# Patient Record
Sex: Male | Born: 1950 | Race: Black or African American | Hispanic: No | Marital: Married | Smoking: Never smoker
Health system: Southern US, Community
[De-identification: ages and names within clinical notes are randomized; demographics above are authoritative.]

## PROBLEM LIST (undated history)

## (undated) DIAGNOSIS — N183 Chronic kidney disease, stage 3 unspecified: Secondary | ICD-10-CM

## (undated) DIAGNOSIS — I10 Essential (primary) hypertension: Secondary | ICD-10-CM

## (undated) DIAGNOSIS — L089 Local infection of the skin and subcutaneous tissue, unspecified: Secondary | ICD-10-CM

## (undated) DIAGNOSIS — E785 Hyperlipidemia, unspecified: Secondary | ICD-10-CM

## (undated) DIAGNOSIS — E1122 Type 2 diabetes mellitus with diabetic chronic kidney disease: Secondary | ICD-10-CM

## (undated) DIAGNOSIS — E119 Type 2 diabetes mellitus without complications: Secondary | ICD-10-CM

## (undated) DIAGNOSIS — E11628 Type 2 diabetes mellitus with other skin complications: Secondary | ICD-10-CM

## (undated) HISTORY — PX: COLONOSCOPY: SHX174

## (undated) HISTORY — PX: TONSILECTOMY/ADENOIDECTOMY WITH MYRINGOTOMY: SHX6125

## (undated) HISTORY — PX: FOOT SURGERY: SHX648

---

## 2017-10-28 ENCOUNTER — Inpatient Hospital Stay (HOSPITAL_COMMUNITY)
Admission: EM | Admit: 2017-10-28 | Discharge: 2017-10-30 | DRG: 872 | Disposition: A | Discharge status: Home Health | Payer: Medicare Other | Attending: Internal Medicine | Admitting: Internal Medicine

## 2017-10-28 ENCOUNTER — Emergency Department (HOSPITAL_COMMUNITY): Payer: Medicare Other

## 2017-10-28 ENCOUNTER — Encounter (HOSPITAL_COMMUNITY): Payer: Self-pay | Admitting: Emergency Medicine

## 2017-10-28 DIAGNOSIS — A419 Sepsis, unspecified organism: Principal | ICD-10-CM | POA: Diagnosis present

## 2017-10-28 DIAGNOSIS — L97519 Non-pressure chronic ulcer of other part of right foot with unspecified severity: Secondary | ICD-10-CM | POA: Diagnosis present

## 2017-10-28 DIAGNOSIS — F329 Major depressive disorder, single episode, unspecified: Secondary | ICD-10-CM | POA: Diagnosis present

## 2017-10-28 DIAGNOSIS — E785 Hyperlipidemia, unspecified: Secondary | ICD-10-CM | POA: Diagnosis present

## 2017-10-28 DIAGNOSIS — N183 Chronic kidney disease, stage 3 unspecified: Secondary | ICD-10-CM | POA: Diagnosis present

## 2017-10-28 DIAGNOSIS — E1122 Type 2 diabetes mellitus with diabetic chronic kidney disease: Secondary | ICD-10-CM | POA: Diagnosis present

## 2017-10-28 DIAGNOSIS — N184 Chronic kidney disease, stage 4 (severe): Secondary | ICD-10-CM | POA: Diagnosis present

## 2017-10-28 DIAGNOSIS — N179 Acute kidney failure, unspecified: Secondary | ICD-10-CM | POA: Diagnosis present

## 2017-10-28 DIAGNOSIS — M79671 Pain in right foot: Secondary | ICD-10-CM | POA: Diagnosis present

## 2017-10-28 DIAGNOSIS — L03115 Cellulitis of right lower limb: Secondary | ICD-10-CM | POA: Diagnosis present

## 2017-10-28 DIAGNOSIS — L97529 Non-pressure chronic ulcer of other part of left foot with unspecified severity: Secondary | ICD-10-CM | POA: Diagnosis present

## 2017-10-28 DIAGNOSIS — L02611 Cutaneous abscess of right foot: Secondary | ICD-10-CM | POA: Diagnosis present

## 2017-10-28 DIAGNOSIS — E11628 Type 2 diabetes mellitus with other skin complications: Secondary | ICD-10-CM | POA: Diagnosis present

## 2017-10-28 DIAGNOSIS — I10 Essential (primary) hypertension: Secondary | ICD-10-CM | POA: Diagnosis present

## 2017-10-28 DIAGNOSIS — E1165 Type 2 diabetes mellitus with hyperglycemia: Secondary | ICD-10-CM | POA: Diagnosis present

## 2017-10-28 DIAGNOSIS — E11610 Type 2 diabetes mellitus with diabetic neuropathic arthropathy: Secondary | ICD-10-CM | POA: Diagnosis present

## 2017-10-28 DIAGNOSIS — E1142 Type 2 diabetes mellitus with diabetic polyneuropathy: Secondary | ICD-10-CM | POA: Diagnosis present

## 2017-10-28 DIAGNOSIS — L97411 Non-pressure chronic ulcer of right heel and midfoot limited to breakdown of skin: Secondary | ICD-10-CM | POA: Diagnosis not present

## 2017-10-28 DIAGNOSIS — Z794 Long term (current) use of insulin: Secondary | ICD-10-CM

## 2017-10-28 DIAGNOSIS — R739 Hyperglycemia, unspecified: Secondary | ICD-10-CM

## 2017-10-28 DIAGNOSIS — F419 Anxiety disorder, unspecified: Secondary | ICD-10-CM | POA: Diagnosis present

## 2017-10-28 DIAGNOSIS — I129 Hypertensive chronic kidney disease with stage 1 through stage 4 chronic kidney disease, or unspecified chronic kidney disease: Secondary | ICD-10-CM | POA: Diagnosis present

## 2017-10-28 DIAGNOSIS — E11621 Type 2 diabetes mellitus with foot ulcer: Secondary | ICD-10-CM | POA: Diagnosis present

## 2017-10-28 DIAGNOSIS — L089 Local infection of the skin and subcutaneous tissue, unspecified: Secondary | ICD-10-CM | POA: Diagnosis not present

## 2017-10-28 HISTORY — DX: Type 2 diabetes mellitus without complications: E11.9

## 2017-10-28 HISTORY — DX: Local infection of the skin and subcutaneous tissue, unspecified: L08.9

## 2017-10-28 HISTORY — DX: Type 2 diabetes mellitus with other skin complications: E11.628

## 2017-10-28 HISTORY — DX: Type 2 diabetes mellitus with diabetic chronic kidney disease: E11.22

## 2017-10-28 HISTORY — DX: Chronic kidney disease, stage 3 unspecified: N18.30

## 2017-10-28 HISTORY — DX: Hyperlipidemia, unspecified: E78.5

## 2017-10-28 HISTORY — DX: Chronic kidney disease, stage 3 (moderate): N18.3

## 2017-10-28 HISTORY — DX: Essential (primary) hypertension: I10

## 2017-10-28 LAB — I-STAT CHEM 8, ED
BUN: 37 mg/dL — ABNORMAL HIGH (ref 8–23)
Calcium, Ion: 1.2 mmol/L (ref 1.15–1.40)
Chloride: 97 mmol/L — ABNORMAL LOW (ref 98–111)
Creatinine, Ser: 2.5 mg/dL — ABNORMAL HIGH (ref 0.61–1.24)
GLUCOSE: 425 mg/dL — AB (ref 70–99)
HCT: 35 % — ABNORMAL LOW (ref 39.0–52.0)
Hemoglobin: 11.9 g/dL — ABNORMAL LOW (ref 13.0–17.0)
Potassium: 4.6 mmol/L (ref 3.5–5.1)
Sodium: 135 mmol/L (ref 135–145)
TCO2: 29 mmol/L (ref 22–32)

## 2017-10-28 LAB — CBC WITH DIFFERENTIAL/PLATELET
Basophils Absolute: 0 10*3/uL (ref 0.0–0.1)
Basophils Relative: 0 %
EOS ABS: 0 10*3/uL (ref 0.0–0.7)
EOS PCT: 0 %
HCT: 34.8 % — ABNORMAL LOW (ref 39.0–52.0)
HEMOGLOBIN: 11.9 g/dL — AB (ref 13.0–17.0)
LYMPHS ABS: 1.3 10*3/uL (ref 0.7–4.0)
LYMPHS PCT: 8 %
MCH: 30.7 pg (ref 26.0–34.0)
MCHC: 34.2 g/dL (ref 30.0–36.0)
MCV: 89.7 fL (ref 78.0–100.0)
MONOS PCT: 11 %
Monocytes Absolute: 1.7 10*3/uL — ABNORMAL HIGH (ref 0.1–1.0)
Neutro Abs: 13.2 10*3/uL — ABNORMAL HIGH (ref 1.7–7.7)
Neutrophils Relative %: 81 %
Platelets: 306 10*3/uL (ref 150–400)
RBC: 3.88 MIL/uL — AB (ref 4.22–5.81)
RDW: 13.8 % (ref 11.5–15.5)
WBC: 16.3 10*3/uL — AB (ref 4.0–10.5)

## 2017-10-28 LAB — I-STAT CG4 LACTIC ACID, ED: Lactic Acid, Venous: 1.8 mmol/L (ref 0.5–1.9)

## 2017-10-28 LAB — GLUCOSE, CAPILLARY: GLUCOSE-CAPILLARY: 318 mg/dL — AB (ref 70–99)

## 2017-10-28 MED ORDER — ONDANSETRON HCL 4 MG PO TABS: 4.0000 mg | ORAL_TABLET | Freq: Four times a day (QID) | ORAL | Status: DC | PRN

## 2017-10-28 MED ORDER — ACETAMINOPHEN 650 MG RE SUPP: 650.0000 mg | Freq: Four times a day (QID) | RECTAL | Status: DC | PRN

## 2017-10-28 MED ORDER — ONDANSETRON HCL 4 MG/2ML IJ SOLN
4.0000 mg | Freq: Four times a day (QID) | INTRAMUSCULAR | Status: DC | PRN
  Administered 2017-10-29: 4 mg via INTRAVENOUS
  Filled 2017-10-28: qty 2

## 2017-10-28 MED ORDER — ACETAMINOPHEN 325 MG PO TABS
650.0000 mg | ORAL_TABLET | Freq: Once | ORAL | Status: AC
  Administered 2017-10-28: 650 mg via ORAL
  Filled 2017-10-28: qty 2

## 2017-10-28 MED ORDER — MORPHINE SULFATE (PF) 4 MG/ML IV SOLN
4.0000 mg | Freq: Once | INTRAVENOUS | Status: AC
  Administered 2017-10-28: 4 mg via INTRAVENOUS
  Filled 2017-10-28: qty 1

## 2017-10-28 MED ORDER — CLINDAMYCIN PHOSPHATE 600 MG/50ML IV SOLN
600.0000 mg | Freq: Once | INTRAVENOUS | Status: AC
  Administered 2017-10-28: 600 mg via INTRAVENOUS
  Filled 2017-10-28: qty 50

## 2017-10-28 MED ORDER — TRANDOLAPRIL 2 MG PO TABS
2.0000 mg | ORAL_TABLET | Freq: Every day | ORAL | Status: DC
  Administered 2017-10-29 – 2017-10-30 (×2): 2 mg via ORAL
  Filled 2017-10-28 (×2): qty 1

## 2017-10-28 MED ORDER — VANCOMYCIN HCL 10 G IV SOLR
2000.0000 mg | Freq: Once | INTRAVENOUS | Status: AC
  Administered 2017-10-29: 2000 mg via INTRAVENOUS
  Filled 2017-10-28: qty 2000

## 2017-10-28 MED ORDER — INSULIN ASPART 100 UNIT/ML ~~LOC~~ SOLN
0.0000 [IU] | Freq: Three times a day (TID) | SUBCUTANEOUS | Status: DC
  Administered 2017-10-29 (×3): 11 [IU] via SUBCUTANEOUS
  Administered 2017-10-30: 3 [IU] via SUBCUTANEOUS
  Administered 2017-10-30: 8 [IU] via SUBCUTANEOUS

## 2017-10-28 MED ORDER — ACETAMINOPHEN 325 MG PO TABS
650.0000 mg | ORAL_TABLET | Freq: Four times a day (QID) | ORAL | Status: DC | PRN
  Administered 2017-10-29 (×2): 650 mg via ORAL
  Filled 2017-10-28 (×2): qty 2

## 2017-10-28 MED ORDER — ENOXAPARIN SODIUM 60 MG/0.6ML ~~LOC~~ SOLN
60.0000 mg | Freq: Every day | SUBCUTANEOUS | Status: DC
  Administered 2017-10-28 – 2017-10-29 (×2): 60 mg via SUBCUTANEOUS
  Filled 2017-10-28 (×2): qty 0.6

## 2017-10-28 MED ORDER — INSULIN ASPART 100 UNIT/ML ~~LOC~~ SOLN
10.0000 [IU] | Freq: Once | SUBCUTANEOUS | Status: AC
  Administered 2017-10-28: 10 [IU] via SUBCUTANEOUS
  Filled 2017-10-28: qty 1

## 2017-10-28 MED ORDER — QUETIAPINE FUMARATE 100 MG PO TABS
200.0000 mg | ORAL_TABLET | Freq: Every day | ORAL | Status: DC
  Administered 2017-10-28 – 2017-10-29 (×2): 200 mg via ORAL
  Filled 2017-10-28 (×2): qty 2

## 2017-10-28 MED ORDER — INSULIN GLARGINE 100 UNIT/ML ~~LOC~~ SOLN
30.0000 [IU] | Freq: Every day | SUBCUTANEOUS | Status: DC
  Administered 2017-10-28 – 2017-10-29 (×2): 30 [IU] via SUBCUTANEOUS
  Filled 2017-10-28 (×3): qty 0.3

## 2017-10-28 MED ORDER — MOEXIPRIL-HYDROCHLOROTHIAZIDE 15-25 MG PO TABS: 1.0000 | ORAL_TABLET | Freq: Every day | ORAL | Status: DC

## 2017-10-28 MED ORDER — INSULIN ASPART 100 UNIT/ML ~~LOC~~ SOLN
4.0000 [IU] | Freq: Three times a day (TID) | SUBCUTANEOUS | Status: DC
  Administered 2017-10-29 – 2017-10-30 (×5): 4 [IU] via SUBCUTANEOUS

## 2017-10-28 MED ORDER — VANCOMYCIN HCL 10 G IV SOLR: 1250.0000 mg | INTRAVENOUS | Status: DC

## 2017-10-28 MED ORDER — HYDROCHLOROTHIAZIDE 25 MG PO TABS
25.0000 mg | ORAL_TABLET | Freq: Every day | ORAL | Status: DC
  Administered 2017-10-29 – 2017-10-30 (×2): 25 mg via ORAL
  Filled 2017-10-28 (×2): qty 1

## 2017-10-28 MED ORDER — L-METHYLFOLATE-B6-B12 3-35-2 MG PO TABS
1.0000 | ORAL_TABLET | Freq: Every day | ORAL | Status: DC
  Administered 2017-10-29 – 2017-10-30 (×2): 1 via ORAL
  Filled 2017-10-28 (×2): qty 1

## 2017-10-28 MED ORDER — LIDOCAINE-EPINEPHRINE (PF) 2 %-1:200000 IJ SOLN
10.0000 mL | Freq: Once | INTRAMUSCULAR | Status: AC
  Administered 2017-10-28: 10 mL via INTRADERMAL
  Filled 2017-10-28: qty 20

## 2017-10-28 MED ORDER — SODIUM CHLORIDE 0.9 % IV BOLUS
1000.0000 mL | Freq: Once | INTRAVENOUS | Status: AC
  Administered 2017-10-28: 1000 mL via INTRAVENOUS

## 2017-10-28 MED ORDER — GABAPENTIN 300 MG PO CAPS
300.0000 mg | ORAL_CAPSULE | Freq: Three times a day (TID) | ORAL | Status: DC
  Administered 2017-10-28 – 2017-10-30 (×5): 300 mg via ORAL
  Filled 2017-10-28 (×5): qty 1

## 2017-10-28 MED ORDER — DIVALPROEX SODIUM ER 500 MG PO TB24
1500.0000 mg | ORAL_TABLET | Freq: Every day | ORAL | Status: DC
  Administered 2017-10-28 – 2017-10-29 (×2): 1500 mg via ORAL
  Filled 2017-10-28 (×2): qty 3

## 2017-10-28 MED ORDER — VANCOMYCIN HCL IN DEXTROSE 1-5 GM/200ML-% IV SOLN
1000.0000 mg | INTRAVENOUS | Status: DC
  Filled 2017-10-28: qty 200

## 2017-10-28 MED ORDER — BUMETANIDE 1 MG PO TABS: 2.0000 mg | ORAL_TABLET | Freq: Every day | ORAL | Status: DC

## 2017-10-28 MED ORDER — SODIUM CHLORIDE 0.9 % IV SOLN
2.0000 g | Freq: Three times a day (TID) | INTRAVENOUS | Status: DC
  Administered 2017-10-28 – 2017-10-29 (×2): 2 g via INTRAVENOUS
  Filled 2017-10-28 (×4): qty 2

## 2017-10-28 NOTE — ED Triage Notes (Signed)
Pt c/o right foot swelling and pain for over week with erythema and possibly infected. Pt wears brace on left lower leg and foot and reports having pain in that foot with swelling as well even with wearing compression stocking. Pt reports that his wife had appts and he wasn't able to go to his PCP yesterday.

## 2017-10-28 NOTE — ED Notes (Signed)
Antibiotics administer by previous RN before blood cultures were obtained by this writter. We were only able to obtain one set of blood cultures.

## 2017-10-28 NOTE — Progress Notes (Signed)
ED TO INPATIENT HANDOFF REPORT  Name/Age/Gender Carl Wilson 67 y.o. male  Code Status    Code Status Orders  (From admission, onward)         Start     Ordered   10/28/17 2135  Full code  Continuous     10/28/17 2135        Code Status History    This patient has a current code status but no historical code status.      Home/SNF/Other Home  Chief Complaint bilateral foot pain  Level of Care/Admitting Diagnosis ED Disposition    ED Disposition Condition Comment   Admit  Hospital Area: [facility] [[id number]]  Level of Care: Med-Surg [16]  Diagnosis: Abscess of right foot [644034]  Admitting Physician: Hillary Bow [[id number]]  Attending Physician: Hillary Bow [[id number]]  Estimated length of stay: past midnight tomorrow  Certification:: I certify this patient will need inpatient services for at least 2 midnights  PT Class (Do Not Modify): Inpatient [101]  PT Acc Code (Do Not Modify): Private [1]       Medical History Past Medical History:  Diagnosis Date  . CKD stage 3 due to type 2 diabetes mellitus (HCC)   . Diabetes mellitus without complication (HCC)   . Diabetic foot infection (HCC)   . HLD (hyperlipidemia)   . HTN (hypertension)     Allergies Allergies  Allergen Reactions  . Ambien [Zolpidem] Other (See Comments)    hallucinations  . Penicillins Hives    Has patient had a PCN reaction causing immediate rash, facial/tongue/throat swelling, SOB or lightheadedness with hypotension: Yes Has patient had a PCN reaction causing severe rash involving mucus membranes or skin necrosis: No Has patient had a PCN reaction that required hospitalization: No Has patient had a PCN reaction occurring within the last 10 years: Yes If all of the above answers are "NO", then may proceed with Cephalosporin use.  . Sulfamethoxazole Hives  . Tape Rash    Can only use cloth/paper tape    IV Location/Drains/Wounds Patient Lines/Drains/Airways  Status   Active Line/Drains/Airways    Name:   Placement date:   Placement time:   Site:   Days:   Peripheral IV 10/28/17 Right;Anterior Forearm   10/28/17    1757    Forearm   less than 1          Labs/Imaging Results for orders placed or performed during the hospital encounter of 10/28/17 (from the past 48 hour(s))  CBC with Differential/Platelet     Status: Abnormal   Collection Time: 10/28/17  3:55 PM  Result Value Ref Range   WBC 16.3 (H) 4.0 - 10.5 K/uL   RBC 3.88 (L) 4.22 - 5.81 MIL/uL   Hemoglobin 11.9 (L) 13.0 - 17.0 g/dL   HCT 87.5 (L) 64.3 - 32.9 %   MCV 89.7 78.0 - 100.0 fL   MCH 30.7 26.0 - 34.0 pg   MCHC 34.2 30.0 - 36.0 g/dL   RDW 51.8 84.1 - 66.0 %   Platelets 306 150 - 400 K/uL   Neutrophils Relative % 81 %   Neutro Abs 13.2 (H) 1.7 - 7.7 K/uL   Lymphocytes Relative 8 %   Lymphs Abs 1.3 0.7 - 4.0 K/uL   Monocytes Relative 11 %   Monocytes Absolute 1.7 (H) 0.1 - 1.0 K/uL   Eosinophils Relative 0 %   Eosinophils Absolute 0.0 0.0 - 0.7 K/uL   Basophils Relative 0 %   Basophils  Absolute 0.0 0.0 - 0.1 K/uL    Comment: Performed at [facility], [address], [place], [place] [address]  I-stat chem 8, ed     Status: Abnormal   Collection Time: 10/28/17  6:04 PM  Result Value Ref Range   Sodium 135 135 - 145 mmol/L   Potassium 4.6 3.5 - 5.1 mmol/L   Chloride 97 (L) 98 - 111 mmol/L   BUN 37 (H) 8 - 23 mg/dL   Creatinine, Ser 7.84 (H) 0.61 - 1.24 mg/dL   Glucose, Bld 696 (H) 70 - 99 mg/dL   Calcium, Ion 2.95 1.15 - 1.40 mmol/L   TCO2 29 22 - 32 mmol/L   Hemoglobin 11.9 (L) 13.0 - 17.0 g/dL   HCT 28.4 (L) 13.2 - 44.0 %  I-Stat CG4 Lactic Acid, ED     Status: None   Collection Time: 10/28/17  6:10 PM  Result Value Ref Range   Lactic Acid, Venous 1.80 0.5 - 1.9 mmol/L   Dg Foot Complete Right  Result Date: 10/28/2017 CLINICAL DATA:  Foot infection EXAM: RIGHT FOOT COMPLETE - 3+ VIEW COMPARISON:  None. FINDINGS: Soft tissue swelling of the  included ankle and foot with pes planus deformity. 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 No acute fracture nor joint dislocations. No frank bone destruction. The tufts of the first and second digits are excluded on the oblique view but appear unremarkable on the additional views. There is osteoarthritis of the fifth PIP articulation with joint space narrowing, sclerosis and spurring. No apparent soft tissue ulceration. IMPRESSION: 1. Pes planus with tibiotalar, subtalar and midfoot osteoarthritis. Mild osteoarthritis of the fifth PIP. 2. Soft tissue swelling of the included ankle and foot. 3. No frank bone destruction. Electronically Signed   By: Tollie Eth M.D.   On: 10/28/2017 17:58    Pending Labs Unresulted Labs (From admission, onward)    Start     Ordered   10/29/17 0500  CBC  Tomorrow morning,   R     10/28/17 2135   10/29/17 0500  Basic metabolic panel  Tomorrow morning,   R     10/28/17 2135   10/29/17 0500  Creatinine, serum  Every 48 hours,   R     10/28/17 2210   10/28/17 2136  Hemoglobin A1c  Once,   R     10/28/17 2148   10/28/17 2136  Sedimentation rate  Once,   R     10/28/17 2148   10/28/17 2136  C-reactive protein  Once,   R     10/28/17 2148   10/28/17 2136  Prealbumin  Once,   R     10/28/17 2148   10/28/17 2134  HIV antibody (Routine Testing)  Once,   R     10/28/17 2135   10/28/17 2134  Aerobic Culture (superficial specimen)  STAT,   R     10/28/17 2135   10/28/17 1837  Blood culture (routine x 2)  BLOOD CULTURE X 2,   STAT     10/28/17 1836          Vitals/Pain Today's Vitals   10/28/17 1847 10/28/17 2017 10/28/17 2100 10/28/17 2200  BP:  (!) 161/83 (!) 148/81 138/67  Pulse:  61 96 92  Resp:  12    Temp:      TempSrc:      SpO2:  98% 97% 97%  Weight:      Height:      PainSc: 5        Isolation Precautions No active  isolations  Medications Medications  acetaminophen (TYLENOL) tablet 650 mg (has no administration in time range)    Or  acetaminophen (TYLENOL) suppository 650 mg (has no administration in time range)  ondansetron (ZOFRAN) tablet 4 mg (has no administration in time range)    Or  ondansetron (ZOFRAN) injection 4 mg (has no administration in time range)  enoxaparin (LOVENOX) injection 40 mg (has no administration in time range)  insulin aspart (novoLOG) injection 4 Units (has no administration in time range)  insulin aspart (novoLOG) injection 0-15 Units (has no administration in time range)  insulin glargine (LANTUS) injection 30 Units (has no administration in time range)  divalproex (DEPAKOTE ER) 24 hr tablet 1,500 mg (has no administration in time range)  gabapentin (NEURONTIN) capsule 300 mg (has no administration in time range)  QUEtiapine (SEROQUEL) tablet 200 mg (has no administration in time range)  l-methylfolate-B6-B12 (METANX) 3-35-2 MG per tablet 1 tablet (has no administration in time range)  moexipril-hydrochlorothiazide (UNIRETIC) 15-25 MG per tablet 1 tablet (has no administration in time range)  vancomycin (VANCOCIN) 2,000 mg in sodium chloride 0.9 % 500 mL IVPB (has no administration in time range)  aztreonam (AZACTAM) 2 g in sodium chloride 0.9 % 100 mL IVPB (has no administration in time range)  vancomycin (VANCOCIN) IVPB 1000 mg/200 mL premix (has no administration in time range)  sodium chloride 0.9 % bolus 1,000 mL (0 mLs Intravenous Stopped 10/28/17 1837)  morphine 4 MG/ML injection 4 mg (4 mg Intravenous Given 10/28/17 1800)  lidocaine-EPINEPHrine (XYLOCAINE W/EPI) 2 %-1:200000 (PF) injection 10 mL (10 mLs Intradermal Given by Other 10/28/17 1800)  clindamycin (CLEOCIN) IVPB 600 mg (0 mg Intravenous Stopped 10/28/17 1920)  acetaminophen (TYLENOL) tablet 650 mg (650 mg Oral Given 10/28/17 1848)  insulin aspart (novoLOG) injection 10 Units (10 Units Subcutaneous Given 10/28/17  1847)    Mobility walks

## 2017-10-28 NOTE — ED Notes (Signed)
Antibiotics started before blood cultures were able to be collected.

## 2017-10-28 NOTE — H&P (Addendum)
History and Physical    Carl Wilson:[id number] DOB: 11-Feb-1951 DOA: 10/28/2017  PCP: Patient, No Pcp Per  Patient coming from: Home  I have personally briefly reviewed patient's old medical records in [facility]  Chief Complaint: R foot infection  HPI: Carl Wilson is a 67 y.o. male with medical history significant of DM2, HTN, CKD stage 3, HLD, prior diabetic foot infections.  Patient presents to the ED with c/o R foot pain and swelling.  L 5th metatarsal injury to foot a cuple of months ago.  Pain and swelling to R foot over past week.  Associated erythema, and developed fever/chills today so family encouraged him to come in to ED.   ED Course: Tm 100.5.  WBC 16.3k.  Creat 2.5 with BUN 37.  Not exactly sure of what his creat is at baseline, knows it went up to ~4 they think a month or two ago, got taken off of diuretics and ACEi, then went down to 1.9.  Then put back on some diuretics and ACEi.  EDP preformed bedside I+D of foot with purulent drainage.  BCx obtained, but no wound cultures obtained during I+D procedure.   Review of Systems: As per HPI otherwise 10 point review of systems negative.   Past Medical History:  Diagnosis Date  . CKD stage 3 due to type 2 diabetes mellitus (HCC)   . Diabetes mellitus without complication (HCC)   . Diabetic foot infection (HCC)   . HLD (hyperlipidemia)   . HTN (hypertension)     Past Surgical History:  Procedure Laterality Date  . COLONOSCOPY    . FOOT SURGERY    . TONSILECTOMY/ADENOIDECTOMY WITH MYRINGOTOMY       reports that he has never smoked. He has never used smokeless tobacco. He reports that he drank alcohol. He reports that he has current or past drug history.  Allergies  Allergen Reactions  . Ambien [Zolpidem] Other (See Comments)    hallucinations  . Penicillins Hives    Has patient had a PCN reaction causing immediate rash, facial/tongue/throat swelling, SOB or lightheadedness with hypotension: Yes Has  patient had a PCN reaction causing severe rash involving mucus membranes or skin necrosis: No Has patient had a PCN reaction that required hospitalization: No Has patient had a PCN reaction occurring within the last 10 years: Yes If all of the above answers are "NO", then may proceed with Cephalosporin use.  . Sulfamethoxazole Hives  . Tape Rash    Can only use cloth/paper tape    Family History  Problem Relation Age of Onset  . Breast cancer Mother   . Thyroid cancer Brother   . Hypertension Father   . Alzheimer's disease Father      Prior to Admission medications   Medication Sig Start Date End Date Taking? Authorizing Provider  bumetanide (BUMEX) 2 MG tablet Take 2 mg by mouth daily. 10/21/17  Yes [provider]  divalproex (DEPAKOTE ER) 500 MG 24 hr tablet Take 1,500 mg by mouth at bedtime.   Yes [provider]  gabapentin (NEURONTIN) 300 MG capsule Take 300 mg by mouth 3 (three) times daily.   Yes [provider]  Insulin Lispro Prot & Lispro (HUMALOG 50/50 MIX) (50-50) 100 UNIT/ML Kwikpen Inject 60 Units into the skin 2 (two) times daily.   Yes [provider]  l-methylfolate-B6-B12 (METANX) 3-35-2 MG TABS tablet Take 1 tablet by mouth daily.   Yes [provider]  moexipril-hydrochlorothiazide (UNIRETIC) 15-25 MG tablet Take  1 tablet by mouth daily.   Yes [provider]  potassium chloride SA (K-DUR,KLOR-CON) 20 MEQ tablet Take 20 mEq by mouth daily. 09/26/17  Yes [provider]  QUEtiapine (SEROQUEL) 100 MG tablet Take 200 mg by mouth at bedtime.   Yes [provider]  Semaglutide (OZEMPIC [place]) Inject 1 mg into the skin every 7 (seven) days.   Yes [provider]  sildenafil (VIAGRA) 50 MG tablet Take 50 mg by mouth daily as needed for erectile dysfunction.   Yes [provider]    Physical Exam: Vitals:   10/28/17 1754 10/28/17 1810 10/28/17 1813 10/28/17 2017  BP:  (!) 150/74 (!)  150/74 (!) 161/83  Pulse:  (!) 103 (!) 106 61  Resp:  16 15 12   Temp: (!) 100.5 F (38.1 C)     TempSrc: Rectal     SpO2:  98% 98% 98%  Weight:      Height:        Constitutional: NAD, calm, comfortable Eyes: PERRL, lids and conjunctivae normal ENMT: Mucous membranes are moist. Posterior pharynx clear of any exudate or lesions.Normal dentition.  Neck: normal, supple, no masses, no thyromegaly Respiratory: clear to auscultation bilaterally, no wheezing, no crackles. Normal respiratory effort. No accessory muscle use.  Cardiovascular: Regular rate and rhythm, no murmurs / rubs / gallops. No extremity edema. 2+ pedal pulses. No carotid bruits.  Abdomen: no tenderness, no masses palpated. No hepatosplenomegaly. Bowel sounds positive.  Musculoskeletal: no clubbing / cyanosis. No joint deformity upper and lower extremities. Good ROM, no contractures. Normal muscle tone.  Skin: Edema, erythema, swelling of plantar surface of R foot. Neurologic: CN 2-12 grossly intact. Sensation intact, DTR normal. Strength 5/5 in all 4.  Psychiatric: Normal judgment and insight. Alert and oriented x 3. Normal mood.    Labs on Admission: I have personally reviewed following labs and imaging studies  CBC: Recent Labs  Lab 10/28/17 1555 10/28/17 1804  WBC 16.3*  --   NEUTROABS 13.2*  --   HGB 11.9* 11.9*  HCT 34.8* 35.0*  MCV 89.7  --   PLT 306  --    Basic Metabolic Panel: Recent Labs  Lab 10/28/17 1804  NA 135  K 4.6  CL 97*  GLUCOSE 425*  BUN 37*  CREATININE 2.50*   GFR: Estimated Creatinine Clearance: 42.3 mL/min (A) (by C-G formula based on SCr of 2.5 mg/dL (H)). Liver Function Tests: No results for input(s): AST, ALT, ALKPHOS, BILITOT, PROT, ALBUMIN in the last 168 hours. No results for input(s): LIPASE, AMYLASE in the last 168 hours. No results for input(s): AMMONIA in the last 168 hours. Coagulation Profile: No results for input(s): INR, PROTIME in the last 168 hours. Cardiac  Enzymes: No results for input(s): CKTOTAL, CKMB, CKMBINDEX, TROPONINI in the last 168 hours. BNP (last 3 results) No results for input(s): PROBNP in the last 8760 hours. HbA1C: No results for input(s): HGBA1C in the last 72 hours. CBG: No results for input(s): GLUCAP in the last 168 hours. Lipid Profile: No results for input(s): CHOL, HDL, LDLCALC, TRIG, CHOLHDL, LDLDIRECT in the last 72 hours. Thyroid Function Tests: No results for input(s): TSH, T4TOTAL, FREET4, T3FREE, THYROIDAB in the last 72 hours. Anemia Panel: No results for input(s): VITAMINB12, FOLATE, FERRITIN, TIBC, IRON, RETICCTPCT in the last 72 hours. Urine analysis: No results found for: COLORURINE, APPEARANCEUR, LABSPEC, PHURINE, GLUCOSEU, HGBUR, BILIRUBINUR, KETONESUR, PROTEINUR, UROBILINOGEN, NITRITE, LEUKOCYTESUR  Radiological Exams on Admission: Dg Foot Complete Right  Result Date: 10/28/2017  CLINICAL DATA:  Foot infection EXAM: RIGHT FOOT COMPLETE - 3+ VIEW COMPARISON:  None. FINDINGS: Soft tissue swelling of the included ankle and foot with pes planus deformity. 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 No acute fracture nor joint dislocations. No frank bone destruction. The tufts of the first and second digits are excluded on the oblique view but appear unremarkable on the additional views. There is osteoarthritis of the fifth PIP articulation with joint space narrowing, sclerosis and spurring. No apparent soft tissue ulceration. IMPRESSION: 1. Pes planus with tibiotalar, subtalar and midfoot osteoarthritis. Mild osteoarthritis of the fifth PIP. 2. Soft tissue swelling of the included ankle and foot. 3. No frank bone destruction. Electronically Signed   By: Tollie Eth M.D.   On: 10/28/2017 17:58    EKG: Independently reviewed.  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 R foot abscess / diabetic foot infection - 1. Diabetic foot pathway 2. EDP did bedside I+D 3. See if we can get wound cultures 4. BCx pending 5. Empiric aztreonam and vanc (PCN allergy). 6. Call either Ortho surgery or podiatry in AM 2. DM2 - 1. Lantus 30 QHS 2. Novolog 4u mealtime 3. Mod scale SSI AC 4. Diabetes coordinator consult as part of foot pathway 3. HTN - 1. Will leave him on the Uniretic 2. Holding Bumex for the moment 4. CKD stage 3 - 1. Sending for records 2. Think it sounds like he may be close to baseline but not 100% certain. 3. Holding bumex, got 1L bolus in ED 4. Repeat BMP in AM and daily while on vanc.  DVT prophylaxis: Lovenox Code Status: Full Family Communication: Family at bedside Disposition Plan: Home after admit Consults called: None, Call either podiatry or ortho surgery in AM Admission status: Admit to inpatient   Hillary Bow DO [facility] Pager [phone] Only works nights!  If 7AM-7PM, please contact the primary day team physician taking care of patient  [email or web address] Password [email or web address]  10/28/2017, 10:10 PM

## 2017-10-28 NOTE — ED Notes (Signed)
Unsuccessful attempt to draw second set of blood cultures.

## 2017-10-28 NOTE — ED Provider Notes (Signed)
[facility]-EMERGENCY DEPT Provider Note   CSN: [id number] Arrival date & time: 10/28/17  1527     History   Chief Complaint Chief Complaint  Patient presents with  . Foot Swelling  . Foot Pain    HPI Carl Wilson is a 67 y.o. male.  The history is provided by the patient and the spouse. No language interpreter was used.  Foot Pain      67 year old male with history of diabetes presenting for evaluation of right foot pain and swelling.  Patient states he has known history of foot problem requiring corrective surgeries in the past.  He suffered from a left fifth metatarsal injury several months prior from falling out of bed.  He has noticed increasing pain and swelling to both his foot right greater than left.  Pain is described as a sharp throbbing sensation, moderate in severity and not improve with home medication.  Wife noticed that he has been tremulous lately the patient denies having fever or chills.  Does have history of diabetes and states his blood sugar fluctuate between 150-160 when he checked.  He lives in [place] and does have a foot specialist.  He denies any recent injury of his foot.  He is up-to-date with tetanus.  No complaint of chest pain shortness of breath or calf pain.  Past Medical History:  Diagnosis Date  . Diabetes mellitus without complication (HCC)     There are no active problems to display for this patient.   History reviewed. No pertinent surgical history.      Home Medications    Prior to Admission medications   Not on File    Family History No family history on file.  Social History Social History   Tobacco Use  . Smoking status: Never Smoker  . Smokeless tobacco: Never Used  Substance Use Topics  . Alcohol use: Not Currently  . Drug use: Not Currently     Allergies   Ambien [zolpidem] and Penicillins   Review of Systems Review of Systems  All other systems reviewed and are  negative.    Physical Exam Updated Vital Signs BP 128/74 (BP Location: Left Arm)   Pulse (!) 103   Temp 99.1 F (37.3 C) (Oral)   Resp 18   Ht 6\' 3"  (1.905 m)   Wt 127 kg   SpO2 96%   BMI 35.00 kg/m   Physical Exam  Constitutional: He appears well-developed and well-nourished. No distress.  Patient appears to be drowsy on exam.  HENT:  Head: Atraumatic.  Eyes: Conjunctivae are normal.  Neck: Neck supple.  Cardiovascular:  Mild tachycardia with faint systolic murmur, no rubs or gallops heard  Pulmonary/Chest: Effort normal and breath sounds normal. No stridor. He has no rales.  Abdominal: Soft. Bowel sounds are normal. He exhibits no distension. There is no tenderness.  Musculoskeletal: He exhibits tenderness (Right foot: Exquisite tenderness noted to the sole of the midfoot with surrounding induration and fluctuant approximately 3 x 5 cm concerning for an abscess.  Dorsalis pedis pulse palpable with brisk cap refill.).  Left foot: No significant point tenderness on palpation, DP pulse palpable.  Neurological: He is alert.  Skin: No rash noted.  Psychiatric: He has a normal mood and affect.  Nursing note and vitals reviewed.    ED Treatments / Results  Labs (all labs ordered are listed, but only abnormal results are displayed) Labs Reviewed  CBC WITH DIFFERENTIAL/PLATELET - Abnormal; Notable for the following components:  Result Value   WBC 16.3 (*)    RBC 3.88 (*)    Hemoglobin 11.9 (*)    HCT 34.8 (*)    Neutro Abs 13.2 (*)    Monocytes Absolute 1.7 (*)    All other components within normal limits  I-STAT CHEM 8, ED - Abnormal; Notable for the following components:   Chloride 97 (*)    BUN 37 (*)    Creatinine, Ser 2.50 (*)    Glucose, Bld 425 (*)    Hemoglobin 11.9 (*)    HCT 35.0 (*)    All other components within normal limits  CULTURE, BLOOD (ROUTINE X 2)  CULTURE, BLOOD (ROUTINE X 2)  I-STAT CG4 LACTIC ACID, ED  CBG MONITORING, ED     EKG None  Radiology Dg Foot Complete Right  Result Date: 10/28/2017 CLINICAL DATA:  Foot infection EXAM: RIGHT FOOT COMPLETE - 3+ VIEW COMPARISON:  None. FINDINGS: Soft tissue swelling of the included ankle and foot with pes planus deformity. 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 No acute fracture nor joint dislocations. No frank bone destruction. The tufts of the first and second digits are excluded on the oblique view but appear unremarkable on the additional views. There is osteoarthritis of the fifth PIP articulation with joint space narrowing, sclerosis and spurring. No apparent soft tissue ulceration. IMPRESSION: 1. Pes planus with tibiotalar, subtalar and midfoot osteoarthritis. Mild osteoarthritis of the fifth PIP. 2. Soft tissue swelling of the included ankle and foot. 3. No frank bone destruction. Electronically Signed   By: Tollie Eth M.D.   On: 10/28/2017 17:58    Procedures .[place] KitchenIncision and Drainage Date/Time: 10/28/2017 7:04 PM Performed by: Fayrene Helper, PA-C Authorized by: Fayrene Helper, PA-C   Consent:    Consent obtained:  Verbal   Consent given by:  Patient   Risks discussed:  Bleeding, incomplete drainage, pain and damage to other organs   Alternatives discussed:  No treatment Universal protocol:    Procedure explained and questions answered to patient or proxy's satisfaction: yes     Relevant documents present and verified: yes     Test results available and properly labeled: yes     Imaging studies available: yes     Required blood products, implants, devices, and special equipment available: yes     Site/side marked: yes     Immediately prior to procedure a time out was called: yes     Patient identity confirmed:  Verbally with patient Location:    Type:  Abscess   Size:  3x5   Location:  Lower extremity   Lower extremity location:  Foot   Foot  location:  R foot Pre-procedure details:    Skin preparation:  Betadine Anesthesia (see MAR for exact dosages):    Anesthesia method:  Local infiltration   Local anesthetic:  Lidocaine 2% WITH epi Procedure type:    Complexity:  Complex Procedure details:    Incision types:  Single straight   Incision depth:  Subcutaneous   Scalpel blade:  11   Wound management:  Probed and deloculated, irrigated with saline and extensive cleaning   Drainage:  Purulent   Drainage amount:  Scant   Wound treatment:  Wound left open   Packing materials:  1/4 in gauze Post-procedure details:    Patient tolerance of procedure:  Tolerated well, no immediate complications  .Critical Care Performed by: Fayrene Helper, PA-C Authorized  by: Fayrene Helper, PA-C   Critical care provider statement:    Critical care time (minutes):  45   Critical care was time spent personally by me on the following activities:  Discussions with consultants, evaluation of patient's response to treatment, examination of patient, ordering and performing treatments and interventions, ordering and review of laboratory studies, ordering and review of radiographic studies, pulse oximetry, re-evaluation of patient's condition, obtaining history from patient or surrogate and review of old charts   (including critical care time)  Medications Ordered in ED Medications  sodium chloride 0.9 % bolus 1,000 mL (0 mLs Intravenous Stopped 10/28/17 1837)  morphine 4 MG/ML injection 4 mg (4 mg Intravenous Given 10/28/17 1800)  lidocaine-EPINEPHrine (XYLOCAINE W/EPI) 2 %-1:200000 (PF) injection 10 mL (10 mLs Intradermal Given by Other 10/28/17 1800)  clindamycin (CLEOCIN) IVPB 600 mg (0 mg Intravenous Stopped 10/28/17 1920)  acetaminophen (TYLENOL) tablet 650 mg (650 mg Oral Given 10/28/17 1848)  insulin aspart (novoLOG) injection 10 Units (10 Units Subcutaneous Given 10/28/17 1847)     Initial Impression / Assessment and Plan / ED Course  I have reviewed the  triage vital signs and the nursing notes.  Pertinent labs & imaging results that were available during my care of the patient were reviewed by me and considered in my medical decision making (see chart for details).     BP (!) 161/83 (BP Location: Right Arm)   Pulse 61   Temp (!) 100.5 F (38.1 C) (Rectal)   Resp 12   Ht 6\' 3"  (1.905 m)   Wt 127 kg   SpO2 98%   BMI 35.00 kg/m    Final Clinical Impressions(s) / ED Diagnoses   Final diagnoses:  Abscess of right foot  Hyperglycemia    ED Discharge Orders    None     5:25 PM Patient with history of diabetes here with right foot pain and findings concerning for potential diabetic foot ulcer with abscess formation.  Plan to obtain x-ray to rule out osteomyelitis, will perform incision and drainage and patient will benefit from antibiotic.  He is mildly tachycardic, will check rectal temp, blood work ordered.   6:49 PM Upon further questioning, patient may have stepped on a sharp object recently when he was prepping his house.  He does have a small puncture wound at the abscess site on the sole of his right foot.  Bedside ultrasound performed by me showing inflammatory changes without obvious collection of fluid.  Incision and drainage procedure was performed with small amount of purulent discharge.  9:35 PM Pt meets sepsis criteria with elevated temperature, leukocytosis and a source.  Blood cultures sent.  Normal lactic acid.  Clindamycin given via IV.  Glucose 425, insulin given.  Appreciate consultation from [facility] Dr. Julian Reil who agrees to see and admit pt.  He request wound culture of R foot.     Fayrene Helper, PA-C 10/28/17 2246    Pricilla Loveless, MD 10/28/17 [phone]

## 2017-10-28 NOTE — ED Notes (Signed)
ED Provider at bedside. 

## 2017-10-28 NOTE — Progress Notes (Signed)
Pharmacy Antibiotic Note  Carl Wilson is a 67 y.o. male admitted on 10/28/2017 with sepsis d/t suspected DM foot wound. PCN allergy listed as hives; no record of having received cephalosporins in Epic or Care Everywhere. Pharmacy has been consulted for Vancomycin and Aztreonam dosing.  Plan:  Vancomycin 2000 mg IV now, then 1000 mg IV q24 hr (est AUC 448 based on SCr 2.5)  Measure vancomycin AUC at steady state as indicated  Aztreonam 2g IV q8 hr  SCr q48 hr while on vancomycin  F/u with patient/family regarding PCN allergy; I suspect this patient can tolerate cephalosporins. Did tolerate ertapenem in 2015  Height: 6\' 3"  (190.5 cm) Weight: 280 lb (127 kg) IBW/kg (Calculated) : 84.5  Temp (24hrs), Avg:99.8 F (37.7 C), Min:99.1 F (37.3 C), Max:100.5 F (38.1 C)  Recent Labs  Lab 10/28/17 1555 10/28/17 1804 10/28/17 1810  WBC 16.3*  --   --   CREATININE  --  2.50*  --   LATICACIDVEN  --   --  1.80    Estimated Creatinine Clearance: 42.3 mL/min (A) (by C-G formula based on SCr of 2.5 mg/dL (H)).    Allergies  Allergen Reactions  . Ambien [Zolpidem] Other (See Comments)    hallucinations  . Penicillins Hives    Has patient had a PCN reaction causing immediate rash, facial/tongue/throat swelling, SOB or lightheadedness with hypotension: Yes Has patient had a PCN reaction causing severe rash involving mucus membranes or skin necrosis: No Has patient had a PCN reaction that required hospitalization: No Has patient had a PCN reaction occurring within the last 10 years: Yes If all of the above answers are "NO", then may proceed with Cephalosporin use.  . Sulfamethoxazole Hives  . Tape Rash    Can only use cloth/paper tape     Thank you for allowing pharmacy to be a part of this patient's care.  Bernadene Person, PharmD, BCPS [phone] 10/28/2017, 10:16 PM

## 2017-10-28 NOTE — Progress Notes (Signed)
Rx Brief note: Lovenox   Wt=128, BMI=35, CrCl~42 ml/min  rx adjusted Lovenox to 60 mg daily  Thanks,  Lorenza Evangelist 10/29/2017 12:25 AM

## 2017-10-28 NOTE — ED Notes (Signed)
XR at bedside

## 2017-10-29 ENCOUNTER — Other Ambulatory Visit: Payer: Self-pay

## 2017-10-29 ENCOUNTER — Inpatient Hospital Stay (HOSPITAL_COMMUNITY): Payer: Medicare Other

## 2017-10-29 DIAGNOSIS — R739 Hyperglycemia, unspecified: Secondary | ICD-10-CM

## 2017-10-29 DIAGNOSIS — I10 Essential (primary) hypertension: Secondary | ICD-10-CM

## 2017-10-29 DIAGNOSIS — N183 Chronic kidney disease, stage 3 (moderate): Secondary | ICD-10-CM

## 2017-10-29 DIAGNOSIS — L089 Local infection of the skin and subcutaneous tissue, unspecified: Secondary | ICD-10-CM

## 2017-10-29 DIAGNOSIS — E1122 Type 2 diabetes mellitus with diabetic chronic kidney disease: Secondary | ICD-10-CM

## 2017-10-29 DIAGNOSIS — E11628 Type 2 diabetes mellitus with other skin complications: Secondary | ICD-10-CM

## 2017-10-29 DIAGNOSIS — L02611 Cutaneous abscess of right foot: Secondary | ICD-10-CM

## 2017-10-29 DIAGNOSIS — L97411 Non-pressure chronic ulcer of right heel and midfoot limited to breakdown of skin: Secondary | ICD-10-CM

## 2017-10-29 LAB — GLUCOSE, CAPILLARY
GLUCOSE-CAPILLARY: 310 mg/dL — AB (ref 70–99)
GLUCOSE-CAPILLARY: 312 mg/dL — AB (ref 70–99)
GLUCOSE-CAPILLARY: 319 mg/dL — AB (ref 70–99)
Glucose-Capillary: 329 mg/dL — ABNORMAL HIGH (ref 70–99)

## 2017-10-29 LAB — CBC
HCT: 29.9 % — ABNORMAL LOW (ref 39.0–52.0)
Hemoglobin: 9.8 g/dL — ABNORMAL LOW (ref 13.0–17.0)
MCH: 29.5 pg (ref 26.0–34.0)
MCHC: 32.8 g/dL (ref 30.0–36.0)
MCV: 90.1 fL (ref 78.0–100.0)
PLATELETS: 242 10*3/uL (ref 150–400)
RBC: 3.32 MIL/uL — AB (ref 4.22–5.81)
RDW: 13.7 % (ref 11.5–15.5)
WBC: 12.7 10*3/uL — ABNORMAL HIGH (ref 4.0–10.5)

## 2017-10-29 LAB — MRSA PCR SCREENING: MRSA BY PCR: NEGATIVE

## 2017-10-29 LAB — BASIC METABOLIC PANEL
ANION GAP: 10 (ref 5–15)
BUN: 38 mg/dL — ABNORMAL HIGH (ref 8–23)
CO2: 28 mmol/L (ref 22–32)
Calcium: 9 mg/dL (ref 8.9–10.3)
Chloride: 100 mmol/L (ref 98–111)
Creatinine, Ser: 2.16 mg/dL — ABNORMAL HIGH (ref 0.61–1.24)
GFR calc non Af Amer: 30 mL/min — ABNORMAL LOW (ref >60)
GFR, EST AFRICAN AMERICAN: 35 mL/min — AB (ref >60)
Glucose, Bld: 381 mg/dL — ABNORMAL HIGH (ref 70–99)
POTASSIUM: 4.6 mmol/L (ref 3.5–5.1)
SODIUM: 138 mmol/L (ref 135–145)

## 2017-10-29 LAB — SEDIMENTATION RATE: Sed Rate: 107 mm/hr — ABNORMAL HIGH (ref 0–16)

## 2017-10-29 LAB — PREALBUMIN: Prealbumin: 21.5 mg/dL (ref 18–38)

## 2017-10-29 LAB — C-REACTIVE PROTEIN: CRP: 12.5 mg/dL — ABNORMAL HIGH (ref <1.0)

## 2017-10-29 LAB — HEMOGLOBIN A1C
Hgb A1c MFr Bld: 11.6 % — ABNORMAL HIGH (ref 4.8–5.6)
MEAN PLASMA GLUCOSE: 286.22 mg/dL

## 2017-10-29 LAB — HIV ANTIBODY (ROUTINE TESTING W REFLEX): HIV SCREEN 4TH GENERATION: NONREACTIVE

## 2017-10-29 MED ORDER — VANCOMYCIN HCL 10 G IV SOLR
1250.0000 mg | INTRAVENOUS | Status: DC
  Administered 2017-10-29: 1250 mg via INTRAVENOUS
  Filled 2017-10-29: qty 1250

## 2017-10-29 MED ORDER — METRONIDAZOLE IN NACL 5-0.79 MG/ML-% IV SOLN
500.0000 mg | Freq: Three times a day (TID) | INTRAVENOUS | Status: DC
  Administered 2017-10-29 – 2017-10-30 (×3): 500 mg via INTRAVENOUS
  Filled 2017-10-29 (×3): qty 100

## 2017-10-29 MED ORDER — SODIUM CHLORIDE 0.9 % IV SOLN
2.0000 g | Freq: Two times a day (BID) | INTRAVENOUS | Status: DC
  Administered 2017-10-29 – 2017-10-30 (×2): 2 g via INTRAVENOUS
  Filled 2017-10-29 (×3): qty 2

## 2017-10-29 MED ORDER — SODIUM CHLORIDE 0.45 % IV SOLN
INTRAVENOUS | Status: DC
  Administered 2017-10-29 (×2): via INTRAVENOUS

## 2017-10-29 NOTE — Consult Note (Signed)
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 male.  HPI: 67 year old diabetic who lives in [place] ,  [place] is here visiting his daughter.  He went to the zoo and was on his feet a lot and then helped work on a fence in the backyard.  He developed plantar foot ulcer with bleeding blister and purulent drainage was seen in the emergency room with fever chills increased white count of 16,300 and was admitted for antibiotics.  Past history of bilateral triple arthrodesis done by Dr. Wilfrid Lund at [facility] when he was very young.  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  He has an ulcer on his opposite left foot which is healing and is been followed by his PCP at his home.  I&D at the bedside in the emergency room with some morphine with some purulent drainage and packing was applied yesterday.  Patient has stage III kidney disease, hypertension, hyperlipidemia.  Past Medical History:  Diagnosis Date  . CKD stage 3 due to type 2 diabetes mellitus ([place])   . Diabetes mellitus without complication ([place])   . Diabetic foot infection ([place])   . HLD (hyperlipidemia)   . HTN (hypertension)     Past Surgical History:  Procedure Laterality Date  . COLONOSCOPY    . FOOT SURGERY    . TONSILECTOMY/ADENOIDECTOMY WITH MYRINGOTOMY      Family History  Problem Relation Age of Onset  . Breast cancer Mother   . Thyroid cancer Brother   . Hypertension Father   . Alzheimer's disease Father     Social History:  reports that he has never smoked. He has never used smokeless tobacco. He reports that he drank alcohol. He reports that he has current or past drug history.  Allergies:  Allergies  Allergen Reactions  . Ambien [Zolpidem] Other (See Comments)    hallucinations  . Penicillins Hives    Has patient had a PCN reaction causing immediate rash, facial/tongue/throat swelling, SOB  or lightheadedness with hypotension: Yes Has patient had a PCN reaction causing severe rash involving mucus membranes or skin necrosis: No Has patient had a PCN reaction that required hospitalization: No Has patient had a PCN reaction occurring within the last 10 years: Yes If all of the above answers are "NO", then may proceed with Cephalosporin use.  . Sulfamethoxazole Hives  . Tape Rash    Can only use cloth/paper tape    Medications: I have reviewed the patient's current medications.  Results for orders placed or performed during the hospital encounter of 10/28/17 (from the past 48 hour(s))  CBC with Differential/Platelet     Status: Abnormal   Collection Time: 10/28/17  3:55 PM  Result Value Ref Range   WBC 16.3 (H) 4.0 - 10.5 K/uL   RBC 3.88 (L) 4.22 - 5.81 MIL/uL   Hemoglobin 11.9 (L) 13.0 - 17.0 g/dL   HCT 34.8 (L) 39.0 - 52.0 %   MCV 89.7 78.0 - 100.0 fL   MCH 30.7 26.0 - 34.0 pg   MCHC 34.2 30.0 - 36.0 g/dL   RDW 13.8 11.5 - 15.5 %   Platelets 306 150 - 400 K/uL   Neutrophils Relative % 81 %   Neutro Abs 13.2 (H) 1.7 - 7.7 K/uL   Lymphocytes Relative 8 %   Lymphs Abs 1.3 0.7 - 4.0 K/uL   Monocytes Relative 11 %  Monocytes Absolute 1.7 (H) 0.1 - 1.0 K/uL   Eosinophils Relative 0 %   Eosinophils Absolute 0.0 0.0 - 0.7 K/uL   Basophils Relative 0 %   Basophils Absolute 0.0 0.0 - 0.1 K/uL    Comment: Performed at [facility], [place] [address], [place], [place] [address]  I-stat chem 8, ed     Status: Abnormal   Collection Time: 10/28/17  6:04 PM  Result Value Ref Range   Sodium 135 135 - 145 mmol/L   Potassium 4.6 3.5 - 5.1 mmol/L   Chloride 97 (L) 98 - 111 mmol/L   BUN 37 (H) 8 - 23 mg/dL   Creatinine, Ser 2.50 (H) 0.61 - 1.24 mg/dL   Glucose, Bld 425 (H) 70 - 99 mg/dL   Calcium, Ion 1.20 1.15 - 1.40 mmol/L   TCO2 29 22 - 32 mmol/L   Hemoglobin 11.9 (L) 13.0 - 17.0 g/dL   HCT 35.0 (L) 39.0 - 52.0 %  I-Stat CG4 Lactic Acid, ED     Status: None    Collection Time: 10/28/17  6:10 PM  Result Value Ref Range   Lactic Acid, Venous 1.80 0.5 - 1.9 mmol/L  Blood culture (routine x 2)     Status: None (Preliminary result)   Collection Time: 10/28/17  8:12 PM  Result Value Ref Range   Specimen Description      BLOOD LEFT ANTECUBITAL Performed at [facility] [address] [place], [place] [address]    Special Requests      BOTTLES DRAWN AEROBIC AND ANAEROBIC Blood Culture results may not be optimal due to an excessive volume of blood received in culture bottles Performed at [facility] [address], [place], [place] [address]    Culture      NO GROWTH < 12 HOURS Performed at [facility] [address], [place], [place] [address]    Report Status PENDING   Glucose, capillary     Status: Abnormal   Collection Time: 10/28/17 11:12 PM  Result Value Ref Range   Glucose-Capillary 318 (H) 70 - 99 mg/dL  Hemoglobin A1c     Status: Abnormal   Collection Time: 10/28/17 11:45 PM  Result Value Ref Range   Hgb A1c MFr Bld 11.6 (H) 4.8 - 5.6 %    Comment: (NOTE) Pre diabetes:          5.7%-6.4% Diabetes:              >6.4% Glycemic control for   <7.0% adults with diabetes    Mean Plasma Glucose 286.22 mg/dL    Comment: Performed at [facility] [address] [place], [place] [address]  Sedimentation rate     Status: Abnormal   Collection Time: 10/28/17 11:45 PM  Result Value Ref Range   Sed Rate 107 (H) 0 - 16 mm/hr    Comment: Performed at [facility], [place] [address], [place], [place] [address]  C-reactive protein     Status: Abnormal   Collection Time: 10/28/17 11:45 PM  Result Value Ref Range   CRP 12.5 (H) <1.0 mg/dL    Comment: Performed at [facility], [place] [address], [place], [place] [address]  Prealbumin     Status: None   Collection Time: 10/28/17 11:45 PM  Result Value Ref Range   Prealbumin 21.5 18 - 38 mg/dL     Comment: Performed at [facility], [place]  [address] [place], [place] [address]  CBC     Status: Abnormal   Collection Time: 10/29/17  4:36 AM  Result Value Ref Range   WBC 12.7 (H) 4.0 - 10.5 K/uL   RBC 3.32 (L) 4.22 - 5.81 MIL/uL   Hemoglobin 9.8 (L) 13.0 - 17.0 g/dL   HCT 29.9 (L) 39.0 - 52.0 %   MCV 90.1 78.0 - 100.0 fL   MCH 29.5 26.0 - 34.0 pg   MCHC 32.8 30.0 - 36.0 g/dL   RDW 13.7 11.5 - 15.5 %   Platelets 242 150 - 400 K/uL    Comment: Performed at [facility], [place] [address] [place], [place] [address]  Basic metabolic panel     Status: Abnormal   Collection Time: 10/29/17  4:36 AM  Result Value Ref Range   Sodium 138 135 - 145 mmol/L   Potassium 4.6 3.5 - 5.1 mmol/L   Chloride 100 98 - 111 mmol/L   CO2 28 22 - 32 mmol/L   Glucose, Bld 381 (H) 70 - 99 mg/dL   BUN 38 (H) 8 - 23 mg/dL   Creatinine, Ser 2.16 (H) 0.61 - 1.24 mg/dL   Calcium 9.0 8.9 - 10.3 mg/dL   GFR calc non Af Amer 30 (L) >60 mL/min   GFR calc Af Amer 35 (L) >60 mL/min    Comment: (NOTE) The eGFR has been calculated using the CKD EPI equation. This calculation has not been validated in all clinical situations. eGFR's persistently <60 mL/min signify possible Chronic Kidney Disease.    Anion gap 10 5 - 15    Comment: Performed at [facility], [place] [address], [place], [place] [address]  Glucose, capillary     Status: Abnormal   Collection Time: 10/29/17  7:37 AM  Result Value Ref Range   Glucose-Capillary 310 (H) 70 - 99 mg/dL  Glucose, capillary     Status: Abnormal   Collection Time: 10/29/17 12:05 PM  Result Value Ref Range   Glucose-Capillary 329 (H) 70 - 99 mg/dL    Dg Foot Complete Right  Result Date: 10/28/2017 CLINICAL DATA:  Foot infection EXAM: RIGHT FOOT COMPLETE - 3+ VIEW COMPARISON:  None. FINDINGS: Soft tissue swelling of the included ankle and foot with pes planus deformity. Osteoarthritis of the tibiotalar and subtalar joints  as well as midfoot articulations with dorsal spurring across the talonavicular and TMT articulations. No acute fracture nor joint dislocations. No frank bone destruction. The tufts of the first and second digits are excluded on the oblique view but appear unremarkable on the additional views. There is osteoarthritis of the fifth PIP articulation with joint space narrowing, sclerosis and spurring. No apparent soft tissue ulceration. IMPRESSION: 1. Pes planus with tibiotalar, subtalar and midfoot osteoarthritis. Mild osteoarthritis of the fifth PIP. 2. Soft tissue swelling of the included ankle and foot. 3. No frank bone destruction. Electronically Signed   By: Ashley Royalty M.D.   On: 10/28/2017 17:58    Review of Systems  Constitutional: Positive for fever. Negative for chills and weight loss.  HENT: Negative for hearing loss and nosebleeds.        History of tonsillectomy and adenoidectomy  Eyes:       Patient with classes  Respiratory: Negative.   Cardiovascular: Negative.   Gastrointestinal: Negative.        His colonoscopy.  Skin:       Right plantar foot ulcer.  Neurological:       Diabetic peripheral  neuropathy  Psychiatric/Behavioral: Negative.    Blood pressure (!) 146/72, pulse 88, temperature 98.7 F (37.1 C), temperature source Oral, resp. rate 16, height _0  (1.905 m), weight 128.9 kg, SpO2 97 %. Physical Exam  Constitutional: He is oriented to person, place, and time. He appears well-developed and well-nourished.  HENT:  Head: Normocephalic.  Eyes: Pupils are equal, round, and reactive to light. Conjunctivae are normal.  Neck: Normal range of motion. Neck supple. No thyromegaly present.  Cardiovascular: Normal rate and regular rhythm.  Respiratory: Effort normal. He has no wheezes.  GI: Soft. He exhibits no distension. There is no tenderness.  Neurological: He is alert and oriented to person, place, and time.  Skin:   less than 1 cm plantar healing ulcer without drainage  midfoot region laterally.  Right foot has.  There is been open note 1 x 1 cm without expression.  Packing quarter inch Nu Gauze was removed.  Mild swelling over the dorsum of the foot.    Assessment/Plan: Packing removed from his foot.  New Tegaderm applied.  Will order MRI scan of his right foot to make sure there is no abscess.  X-rays were reviewed which shows no evidence of osteomyelitis.  He does have triple arthrodesis previously performed and some midfoot degenerative changes likely early Charcot changes.  A1c is 11.6 consistent with poor control.  Plan to be continued IV antibiotics unless MRI scan shows abscess that needs to be drained.my cell [phone]  Marybelle Killings 10/29/2017, 3:17 PM

## 2017-10-29 NOTE — Consult Note (Signed)
WOC Nurse wound consult note I spoke with patient's primary RN, Christy via telephone.  I learned that she had spoken with Dr. Margo Aye before my phone call and was informed that orthopedic services would be coming to see the patient today.  Also, the patient has a film dressing over the I&D site. Also, apparently both feet are swollen and erythematous. Reason for Consult: Lower extremity wound Since Orthopedic services has been consulted, I will defer treatment of the area to their care.  Please re-consult the WOC team if additional needs are identified. Monitor the wound area(s) for worsening of condition such as: Signs/symptoms of infection,  Increase in size,  Development of or worsening of odor, Development of pain, or increased pain at the affected locations.  Notify the medical team if any of these develop.  Thank you for the consult.   Helmut Muster, RN, MSN, CWOCN, CNS-BC, pager [phone]

## 2017-10-29 NOTE — Progress Notes (Signed)
Pharmacy Antibiotic Note  Carl Wilson is a 67 y.o. male admitted on 10/28/2017 with R foot abscess/diabetic foot infection. PCN allergy listed as hives; no record of having received cephalosporins in Epic or Care Everywhere. Pharmacy initially consulted for Vancomycin and Aztreonam dosing.   Note, patient tolerated Ertapenem at outside hospital in 2015 per Care Everywhere, so MD wishes to transition Aztreonam to Cefepime and closely monitor patient for reaction. MD also adding Metronidazole for anaerobic coverage.   Plan:  Adjust Vancomycin maintenance dose to 1250mg  IV q24h for improved renal function for goal AUC 400-500  Measure vancomycin AUC at steady state as indicated  Cefepime 2g IV q12h (alerted RN to monitor patient closely for reaction)  Metronidazole 500mg  IV q8h per MD.   Monitor renal function, cultures, clinical course.   Height: 6\' 3"  (190.5 cm) Weight: 284 lb 2.8 oz (128.9 kg) IBW/kg (Calculated) : 84.5  Temp (24hrs), Avg:99.6 F (37.6 C), Min:99.1 F (37.3 C), Max:100.5 F (38.1 C)  Recent Labs  Lab 10/28/17 1555 10/28/17 1804 10/28/17 1810 10/29/17 0436  WBC 16.3*  --   --  12.7*  CREATININE  --  2.50*  --  2.16*  LATICACIDVEN  --   --  1.80  --     Estimated Creatinine Clearance: 49.3 mL/min (A) (by C-G formula based on SCr of 2.16 mg/dL (H)).    Allergies  Allergen Reactions  . Ambien [Zolpidem] Other (See Comments)    hallucinations  . Penicillins Hives    Has patient had a PCN reaction causing immediate rash, facial/tongue/throat swelling, SOB or lightheadedness with hypotension: Yes Has patient had a PCN reaction causing severe rash involving mucus membranes or skin necrosis: No Has patient had a PCN reaction that required hospitalization: No Has patient had a PCN reaction occurring within the last 10 years: Yes If all of the above answers are "NO", then may proceed with Cephalosporin use.  . Sulfamethoxazole Hives  . Tape Rash    Can only use  cloth/paper tape    Antimicrobials this admission:  9/6 Clindamycin x 1 9/6 Aztreonam >> 9/7 9/7 Vancomycin >>  9/7 Cefepime >> 9/7 Metronidazole >>  Microbiology results:  9/6 BCx: IP Superficial wound cx: ordered, but doesn't appear to have been collected    Thank you for allowing pharmacy to be a part of this patient's care.    Greer Pickerel, PharmD, BCPS Pager: [phone] 10/29/2017 2:00 PM

## 2017-10-29 NOTE — Progress Notes (Addendum)
CSW consulted for access to medication. RNCM alerted.    Signing off - if CSW needs arise, please reconsult.      Enid Cutter, MSW, LCSWA Clinical Social Work [phone]

## 2017-10-29 NOTE — Progress Notes (Addendum)
Inpatient Diabetes Program Recommendations  AACE/ADA: New Consensus Statement on Inpatient Glycemic Control (2015)  Target Ranges:  Prepandial:   less than 140 mg/dL      Peak postprandial:   less than 180 mg/dL (1-2 hours)      Critically ill patients:  140 - 180 mg/dL   Results for Carl Wilson, Carl Wilson (MRN [id number]) as of 10/29/2017 15:15  Ref. Range 10/28/2017 23:12 10/29/2017 07:37 10/29/2017 12:05  Glucose-Capillary Latest Ref Range: 70 - 99 mg/dL 597 (H) 416 (H) Novolog 15 units given 329 (H) Novolog 15 units given   Review of Glycemic Control  Diabetes history: DM 2 Outpatient Diabetes medications: Humalog 50/50 60 units BID, Ozempic 1 mg Weekly Current orders for Inpatient glycemic control: Lantus 30 units, Novolog 0-15 units tid, Novolog 4 units tid meal coverage  Inpatient Diabetes Program Recommendations:    A1c 11.6 % this admission  Glucose trends in the 300's, patient is on more basal insulin and meal coverage at home. Please consider increasing Lantus to 40 units, Consider increasing Novolog meal coverage to 8 units tid if patient consumes at least 50% of meals.  Thanks,  Christena Deem RN, MSN, BC-ADM Inpatient Diabetes Coordinator Team Pager [phone] (8a-5p)

## 2017-10-29 NOTE — Progress Notes (Addendum)
PROGRESS NOTE  Carl Wilson WUJ:[id number] DOB: 1950/04/29 DOA: 10/28/2017 PCP: Patient, No Pcp Per  HPI/Recap of past 24 hours: Carl Wilson is a 67 y.o. male with medical history significant of uncontrolled DM2, HTN, CKD stage 3, HLD, charcot with prior diabetic foot infections, diabetes polyneuropathy, presents to the ED with c/o R foot pain and swelling.  L 5th metatarsal injury to foot a couple of months ago.  Pain and swelling to R foot over past week.  Associated erythema, and developed fever/chills today so family encouraged him to come in to ED.  ED Course: Tm 100.5.  WBC 16.3k.  Creat 2.5 with BUN 37. EDP preformed bedside I+D of foot with purulent drainage. BCx obtained, but no wound cultures obtained during I+D procedure.  Assessment/Plan: Principal Problem:   Abscess of right foot Active Problems:   Type 2 diabetes mellitus with right diabetic foot infection (HCC)   HTN (hypertension)   CKD stage 3 due to type 2 diabetes mellitus (HCC)  Sepsis 2/2 R foot wound in the setting o4funcontrolled dm2, polyneuropathy and charcot foot Wbc 16k, Temp 100.5, HR 114 I&D in the ED Ortho consulted Dr YLorin Mercywill see NPO after midnight due to possible debridement Xray R foot, per report no bony involvement Defer to ortho to order MRI right foot if needed Will obtain ESR CRP 12 Monitor fever curve and wbc C/w cefepime, IV flagyl, and IV vancomycin Obtain MRSA screen if negative DC IV vancomycin  unconcontrolled dm2 with hyperglycemia A1C 11.6 C/w ISS, lantus, novolog Heart healthy carb diet Avoid hyperglycemia due to need for proper wound healing  Polyneuropathy C/w gabapentin Fall precautions  AKI on CKD4 Baseline cr 2.1 with gfr 35 Cr presentation 2.50 Avoid nephjrotoxic agents/dehydration/hypotension Gentle IV fluid hydration 1/2 NS at 75cc/hr Repeat BMP in the am  HTN BP stable C/w hypertensive meds Monitor  Chronic anxiety/depression C/w seroquel    Code  Status: full   Family Communication: youngest daughter and wife at bedside   Disposition Plan: Home in 1-2 days or when ortho signs off    Consultants:  Ortho, Dr YLorin Mercy Procedures:  I&D in the ED on 10/28/17   Antimicrobials:  Cefepime  IV flagyl  IV vancomycin  DVT prophylaxis:  sq lovenox daily   Objective: Vitals:   10/28/17 2100 10/28/17 2200 10/28/17 2255 10/29/17 0537  BP: (!) 148/81 138/67  (!) 149/82  Pulse: 96 92  99  Resp:    16  Temp:    99.3 F (37.4 C)  TempSrc:    Oral  SpO2: 97% 97%  95%  Weight:   128.9 kg   Height:   _0  (1.905 m)     Intake/Output Summary (Last 24 hours) at 10/29/2017 1404 Last data filed at 10/29/2017 1400 Gross per 24 hour  Intake 2260.05 ml  Output 1000 ml  Net 1260.05 ml   Filed Weights   10/28/17 1551 10/28/17 2255  Weight: 127 kg 128.9 kg    Exam:  . General: 67y.o. year-old male well developed well nourished in no acute distress.  Alert and oriented x3. . Cardiovascular: Regular rate and rhythm with no rubs or gallops.  No thyromegaly or JVD noted.   .Marland KitchenRespiratory: Clear to auscultation with no wheezes or rales. Good inspiratory effort. . Abdomen: Soft nontender nondistended with normal bowel sounds x4 quadrants. . Musculoskeletal: No lower extremity edema. 2/4 pulses in all 4 extremities. Charcot feet; both with ulcerative wound at plantar region. .Marland KitchenPsychiatry:  Mood is appropriate for condition and setting   Data Reviewed: CBC: Recent Labs  Lab 10/28/17 1555 10/28/17 1804 10/29/17 0436  WBC 16.3*  --  12.7*  NEUTROABS 13.2*  --   --   HGB 11.9* 11.9* 9.8*  HCT 34.8* 35.0* 29.9*  MCV 89.7  --  90.1  PLT 306  --  295   Basic Metabolic Panel: Recent Labs  Lab 10/28/17 1804 10/29/17 0436  NA 135 138  K 4.6 4.6  CL 97* 100  CO2  --  28  GLUCOSE 425* 381*  BUN 37* 38*  CREATININE 2.50* 2.16*  CALCIUM  --  9.0   GFR: Estimated Creatinine Clearance: 49.3 mL/min (A) (by C-G formula based on SCr  of 2.16 mg/dL (H)). Liver Function Tests: No results for input(s): AST, ALT, ALKPHOS, BILITOT, PROT, ALBUMIN in the last 168 hours. No results for input(s): LIPASE, AMYLASE in the last 168 hours. No results for input(s): AMMONIA in the last 168 hours. Coagulation Profile: No results for input(s): INR, PROTIME in the last 168 hours. Cardiac Enzymes: No results for input(s): CKTOTAL, CKMB, CKMBINDEX, TROPONINI in the last 168 hours. BNP (last 3 results) No results for input(s): PROBNP in the last 8760 hours. HbA1C: Recent Labs    10/28/17 2345  HGBA1C 11.6*   CBG: Recent Labs  Lab 10/28/17 2312 10/29/17 0737 10/29/17 1205  GLUCAP 318* 310* 329*   Lipid Profile: No results for input(s): CHOL, HDL, LDLCALC, TRIG, CHOLHDL, LDLDIRECT in the last 72 hours. Thyroid Function Tests: No results for input(s): TSH, T4TOTAL, FREET4, T3FREE, THYROIDAB in the last 72 hours. Anemia Panel: No results for input(s): VITAMINB12, FOLATE, FERRITIN, TIBC, IRON, RETICCTPCT in the last 72 hours. Urine analysis: No results found for: COLORURINE, APPEARANCEUR, [place], [place], GLUCOSEU, HGBUR, BILIRUBINUR, KETONESUR, PROTEINUR, UROBILINOGEN, NITRITE, LEUKOCYTESUR Sepsis Labs: _0 (procalcitonin:4,lacticidven:4)  ) Recent Results (from the past 240 hour(s))  Blood culture (routine x 2)     Status: None (Preliminary result)   Collection Time: 10/28/17  8:12 PM  Result Value Ref Range Status   Specimen Description   Final    BLOOD LEFT ANTECUBITAL Performed at [facility] [address], [place], [place] [address]    Special Requests   Final    BOTTLES DRAWN AEROBIC AND ANAEROBIC Blood Culture results may not be optimal due to an excessive volume of blood received in culture bottles Performed at [facility] [address] [place], [place] [address]    Culture   Final    NO GROWTH < 12 HOURS Performed at [facility] [address],  [place],  [address]    Report Status PENDING  Incomplete      Studies: Dg Foot Complete Right  Result Date: 10/28/2017 CLINICAL DATA:  Foot infection EXAM: RIGHT FOOT COMPLETE - 3+ VIEW COMPARISON:  None. FINDINGS: Soft tissue swelling of the included ankle and foot with pes planus deformity. 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 No acute fracture nor joint dislocations. No frank bone destruction. The tufts of the first and second digits are excluded on the oblique view but appear unremarkable on the additional views. There is osteoarthritis of the fifth PIP articulation with joint space narrowing, sclerosis and spurring. No apparent soft tissue ulceration. IMPRESSION: 1. Pes planus with tibiotalar, subtalar and midfoot osteoarthritis. Mild osteoarthritis of the fifth PIP. 2. Soft tissue swelling of the included  ankle and foot. 3. No frank bone destruction. Electronically Signed   By: Ashley Royalty M.D.   On: 10/28/2017 17:58    Scheduled Meds: . divalproex  1,500 mg Oral QHS  . enoxaparin (LOVENOX) injection  60 mg Subcutaneous QHS  . gabapentin  300 mg Oral TID  . trandolapril  2 mg Oral Daily   And  . hydrochlorothiazide  25 mg Oral Daily  . insulin aspart  0-15 Units Subcutaneous TID WC  . insulin aspart  4 Units Subcutaneous TID WC  . insulin glargine  30 Units Subcutaneous QHS  . l-methylfolate-B6-B12  1 tablet Oral Daily  . QUEtiapine  200 mg Oral QHS    Continuous Infusions: . aztreonam 2 g (10/29/17 0532)  . vancomycin       LOS: 1 day     Kayleen Memos, MD [facility] Pager [phone]  If 7PM-7AM, please contact night-coverage [email or web address] Password [email or web address] 10/29/2017, 2:04 PM

## 2017-10-29 NOTE — Progress Notes (Signed)
Patient ID: Carl Wilson, male   DOB: 03-18-1950, 67 y.o.   MRN: [id number] MRI report and images reviewed. No abscess. Cellulitis. Previous triple hindfoot arthrodesis is solid.  No surgery at this time. Continue IV ABX for 1 to 2 days , when WBC back to normal  then PO ABX. He can followup with local Ortho MD back in [place] where he lives.  [phone] my cell.

## 2017-10-30 LAB — CBC
HEMATOCRIT: 29.9 % — AB (ref 39.0–52.0)
HEMOGLOBIN: 10 g/dL — AB (ref 13.0–17.0)
MCH: 29.9 pg (ref 26.0–34.0)
MCHC: 33.4 g/dL (ref 30.0–36.0)
MCV: 89.5 fL (ref 78.0–100.0)
Platelets: 255 10*3/uL (ref 150–400)
RBC: 3.34 MIL/uL — ABNORMAL LOW (ref 4.22–5.81)
RDW: 13.6 % (ref 11.5–15.5)
WBC: 8.7 10*3/uL (ref 4.0–10.5)

## 2017-10-30 LAB — BASIC METABOLIC PANEL
ANION GAP: 9 (ref 5–15)
BUN: 32 mg/dL — AB (ref 8–23)
CHLORIDE: 102 mmol/L (ref 98–111)
CO2: 27 mmol/L (ref 22–32)
Calcium: 9.2 mg/dL (ref 8.9–10.3)
Creatinine, Ser: 1.92 mg/dL — ABNORMAL HIGH (ref 0.61–1.24)
GFR calc Af Amer: 41 mL/min — ABNORMAL LOW (ref >60)
GFR calc non Af Amer: 35 mL/min — ABNORMAL LOW (ref >60)
GLUCOSE: 220 mg/dL — AB (ref 70–99)
POTASSIUM: 4.2 mmol/L (ref 3.5–5.1)
Sodium: 138 mmol/L (ref 135–145)

## 2017-10-30 LAB — GLUCOSE, CAPILLARY
GLUCOSE-CAPILLARY: 186 mg/dL — AB (ref 70–99)
Glucose-Capillary: 269 mg/dL — ABNORMAL HIGH (ref 70–99)

## 2017-10-30 LAB — LACTIC ACID, PLASMA
Lactic Acid, Venous: 0.8 mmol/L (ref 0.5–1.9)
Lactic Acid, Venous: 0.8 mmol/L (ref 0.5–1.9)

## 2017-10-30 MED ORDER — INSULIN GLARGINE 100 UNIT/ML ~~LOC~~ SOLN: 30.0000 [IU] | Freq: Every day | SUBCUTANEOUS | 0 refills | Status: AC

## 2017-10-30 MED ORDER — INSULIN STARTER KIT- SYRINGES (ENGLISH)
1.0000 | Freq: Once | Status: DC
  Filled 2017-10-30: qty 1

## 2017-10-30 MED ORDER — INSULIN ASPART 100 UNIT/ML ~~LOC~~ SOLN: 4.0000 [IU] | Freq: Three times a day (TID) | SUBCUTANEOUS | 0 refills | Status: AC

## 2017-10-30 MED ORDER — HYDROCHLOROTHIAZIDE 25 MG PO TABS: 25.0000 mg | ORAL_TABLET | Freq: Every day | ORAL | 0 refills | Status: AC

## 2017-10-30 MED ORDER — CEPHALEXIN 500 MG PO CAPS
500.0000 mg | ORAL_CAPSULE | Freq: Two times a day (BID) | ORAL | Status: DC
  Administered 2017-10-30: 500 mg via ORAL
  Filled 2017-10-30: qty 1

## 2017-10-30 MED ORDER — INSULIN STARTER KIT- SYRINGES (ENGLISH): 1.0000 | Freq: Once | 0 refills | Status: AC

## 2017-10-30 MED ORDER — BLOOD GLUCOSE MONITOR KIT: PACK | 0 refills | Status: AC

## 2017-10-30 MED ORDER — TRANDOLAPRIL 2 MG PO TABS: 2.0000 mg | ORAL_TABLET | Freq: Every day | ORAL | 0 refills | Status: AC

## 2017-10-30 MED ORDER — CEPHALEXIN 500 MG PO CAPS: 500.0000 mg | ORAL_CAPSULE | Freq: Two times a day (BID) | ORAL | 0 refills | Status: AC

## 2017-10-30 MED ORDER — INSULIN ASPART 100 UNIT/ML ~~LOC~~ SOLN: 0.0000 [IU] | Freq: Three times a day (TID) | SUBCUTANEOUS | 0 refills | Status: AC

## 2017-10-30 NOTE — Discharge Instructions (Signed)
Hyperglycemia Hyperglycemia is when the sugar (glucose) level in your blood is too high. It may not cause symptoms. If you do have symptoms, they may include warning signs, such as:  Feeling more thirsty than normal.  Hunger.  Feeling tired.  Needing to pee (urinate) more than normal.  Blurry eyesight (vision). You may get other symptoms as it gets worse, such as:  Dry mouth.  Not being hungry (loss of appetite).  Fruity-smelling breath.  Weakness.  Weight gain or loss that is not planned. Weight loss may be fast.  A tingling or numb feeling in your hands or feet.  Headache.  Skin that does not bounce back quickly when it is lightly pinched and released (poor skin turgor).  Pain in your belly (abdomen).  Cuts or bruises that heal slowly. High blood sugar can happen to people who do or do not have diabetes. High blood sugar can happen slowly or quickly, and it can be an emergency. Follow these instructions at home: General instructions  Take over-the-counter and prescription medicines only as told by your doctor.  Do not use products that contain nicotine or tobacco, such as cigarettes and e-cigarettes. If you need help quitting, ask your doctor.  Limit alcohol intake to no more than 1 drink per day for nonpregnant women and 2 drinks per day for men. One drink equals 12 oz of beer, 5 oz of wine, or 1 oz of hard liquor.  Manage stress. If you need help with this, ask your doctor.  Keep all follow-up visits as told by your doctor. This is important. Eating and drinking  Stay at a healthy weight.  Exercise regularly, as told by your doctor.  Drink enough fluid, especially when you:  Exercise.  Get sick.  Are in hot temperatures.  Eat healthy foods, such as:  Low-fat (lean) proteins.  Complex carbs (complex carbohydrates), such as whole wheat bread or brown rice.  Fresh fruits and vegetables.  Low-fat dairy products.  Healthy fats.  Drink enough  fluid to keep your pee (urine) clear or pale yellow. If you have diabetes:  Make sure you know the symptoms of hyperglycemia.  Follow your diabetes management plan, as told by your doctor. Make sure you:  Take insulin and medicines as told.  Follow your exercise plan.  Follow your meal plan. Eat on time. Do not skip meals.  Check your blood sugar as often as told. Make sure to check before and after exercise. If you exercise longer or in a different way than you normally do, check your blood sugar more often.  Follow your sick day plan whenever you cannot eat or drink normally. Make this plan ahead of time with your doctor.  Share your diabetes management plan with people in your workplace, school, and household.  Check your urine for ketones when you are ill and as told by your doctor.  Carry a card or wear jewelry that says that you have diabetes. Contact a doctor if:  Your blood sugar level is higher than 240 mg/dL (13.3 mmol/L) for 2 days in a row.  You have problems keeping your blood sugar in your target range.  High blood sugar happens often for you. Get help right away if:  You have trouble breathing.  You have a change in how you think, feel, or act (mental status).  You feel sick to your stomach (nauseous), and that feeling does not go away.  You cannot stop throwing up (vomiting). These symptoms may be an   be an emergency. Do not wait to see if the symptoms will go away. Get medical help right away. Call your local emergency services (911 in the [place] Do not drive yourself to the hospital. Summary  Hyperglycemia is when the sugar (glucose) level in your blood is too high.  High blood sugar can happen to people who do or do not have diabetes.  Make sure you drink enough fluids, eat healthy foods, and exercise regularly.  Contact your doctor if you have problems keeping your blood sugar in your target range. This information is not intended to replace advice given  to you by your health care provider. Make sure you discuss any questions you have with your health care provider. Document Released: 12/06/2008 Document Revised: 10/27/2015 Document Reviewed: 10/27/2015 Elsevier Interactive Patient Education  2017 [employer]vier Inc.   Incision and Drainage, Care After Refer to this sheet in the next few weeks. These instructions provide you with information about caring for yourself after your procedure. Your health care provider may also give you more specific instructions. Your treatment has been planned according to current medical practices, but problems sometimes occur. Call your health care provider if you have any problems or questions after your procedure. What can I expect after the procedure? After the procedure, it is common to have:  Pain or discomfort around your incision site.  Drainage from your incision.  Follow these instructions at home:  Take over-the-counter and prescription medicines only as told by your health care provider.  If you were prescribed an antibiotic medicine, take it as told by your health care provider.Do not stop taking the antibiotic even if you start to feel better.  Followinstructions from your health care provider about: ? How to take care of your incision. ? When and how you should change your packing and bandage (dressing). Wash your hands with soap and water before you change your dressing. If soap and water are not available, use hand sanitizer. ? When you should remove your dressing.  Do not take baths, swim, or use a hot tub until your health care provider approves.  Keep all follow-up visits as told by your health care provider. This is important.  Check your incision area every day for signs of infection. Check for: ? More redness, swelling, or pain. ? More fluid or blood. ? Warmth. ? Pus or a bad smell. Contact a health care provider if:  Your cyst or abscess returns.  You have a fever.  You have  more redness, swelling, or pain around your incision.  You have more fluid or blood coming from your incision.  Your incision feels warm to the touch.  You have pus or a bad smell coming from your incision. Get help right away if:  You have severe pain or bleeding.  You cannot eat or drink without vomiting.  You have decreased urine output.  You become short of breath.  You have chest pain.  You cough up blood.  The area where the incision and drainage occurred becomes numb or it tingles. This information is not intended to replace advice given to you by your health care provider. Make sure you discuss any questions you have with your health care provider. Document Released: 05/03/2011 Document Revised: 07/11/2015 Document Reviewed: 11/29/2014 Elsevier Interactive Patient Education  [employer].

## 2017-10-30 NOTE — Care Management Note (Addendum)
Case Management Note  Patient Details  Name: Kelin Wiklund MRN: [id number] Date of Birth: [birth year]  Subjective/Objective:    Right foot infection                Action/Plan: NCM spoke to pt/wife and states offered choice for [facility]. Pt requested [facility]. He has RW and bedside commode to use at home. Wife will pick up a cane from pharmacy. Contacted [facility] and emergency oncall states they are not open on Sunday. Fax number is [phone], faxed referral to [facility]. Will follow up on 10/31/2017.  4:23 pm Contacted wife via phone, PCP Dr Modesto Charon. Requested NCM fax dc summary to PCP. She will call tomorrow to arrange follow up appt. Explained [facility] was closed on Sunday and will follow up on referral 9/9.  Expected Discharge Date:  10/30/17               Expected Discharge Plan:  Home w Home Health Services  In-House Referral:  NA  Discharge planning Services  CM Consult  Post Acute Care Choice:  Home Health Choice offered to:  Spouse  DME Arranged:  N/A DME Agency:  NA  HH Arranged:  RN HH Agency:  [facility]  Status of Service:  Completed, signed off  If discussed at Long Length of Stay Meetings, dates discussed:    Additional Comments:  Elliot Cousin, RN 10/30/2017, 3:50 PM

## 2017-10-30 NOTE — Evaluation (Signed)
Physical Therapy Evaluation Patient Details Name: Carl Wilson MRN: [id number] DOB: 1950-06-05 Today's Date: 10/30/2017   History of Present Illness  67 y.o. male with medical history significant of uncontrolled DM2, HTN, CKD stage 3, HLD, charcot with prior diabetic foot infections, diabetes polyneuropathy, presents to the ED with c/o R foot pain and swelling.  L 5th metatarsal injury to foot a couple of months ago.  Pain and swelling to R foot over past week. s/p I&D.  Clinical Impression  Pt ambulated 200' with a cane with no loss of balance. He is ready to DC home from PT standpoint, no further PT indicated. He would benefit from a cane for use at home. PT signing off.     Follow Up Recommendations No PT follow up    Equipment Recommendations  Cane    Recommendations for Other Services       Precautions / Restrictions Precautions Precautions: None Restrictions Weight Bearing Restrictions: No      Mobility  Bed Mobility Overal bed mobility: Independent                Transfers Overall transfer level: Independent                  Ambulation/Gait Ambulation/Gait assistance: Modified independent (Device/Increase time) Gait Distance (Feet): 200 Feet Assistive device: Straight cane Gait Pattern/deviations: Decreased stride length;Decreased dorsiflexion - right     General Gait Details: steady with SPC, no loss of balance  Stairs            Wheelchair Mobility    Modified Rankin (Stroke Patients Only)       Balance Overall balance assessment: Modified Independent                                           Pertinent Vitals/Pain Pain Assessment: 0-10 Pain Score: 3  Pain Location: R foot Pain Descriptors / Indicators: Sore Pain Intervention(s): Limited activity within patient's tolerance;Monitored during session    Home Living Family/patient expects to be discharged to:: Private residence Living Arrangements:  Spouse/significant other Available Help at Discharge: Family;Available 24 hours/day Type of Home: House Home Access: Stairs to enter Entrance Stairs-Rails: Right Entrance Stairs-Number of Steps: 4 Home Layout: Two level;Able to live on main level with bedroom/bathroom Home Equipment: Dan Humphreys - 2 wheels;Bedside commode      Prior Function Level of Independence: Independent with assistive device(s)         Comments: at times pt borrow's his wife's SPC     Hand Dominance        Extremity/Trunk Assessment   Upper Extremity Assessment Upper Extremity Assessment: Overall WFL for tasks assessed    Lower Extremity Assessment Lower Extremity Assessment: Overall WFL for tasks assessed(h/o peripheral neuropathy B feet)    Cervical / Trunk Assessment Cervical / Trunk Assessment: Normal  Communication   Communication: HOH  Cognition Arousal/Alertness: Awake/alert Behavior During Therapy: WFL for tasks assessed/performed Overall Cognitive Status: Within Functional Limits for tasks assessed                                        General Comments      Exercises     Assessment/Plan    PT Assessment Patent does not need any further PT services  PT Problem List  PT Treatment Interventions      PT Goals (Current goals can be found in the Care Plan section)  Acute Rehab PT Goals PT Goal Formulation: All assessment and education complete, DC therapy    Frequency     Barriers to discharge        Co-evaluation               AM-PAC PT "6 Clicks" Daily Activity  Outcome Measure Difficulty turning over in bed (including adjusting bedclothes, sheets and blankets)?: None Difficulty moving from lying on back to sitting on the side of the bed? : None Difficulty sitting down on and standing up from a chair with arms (e.g., wheelchair, bedside commode, etc,.)?: None Help needed moving to and from a bed to chair (including a wheelchair)?:  None Help needed walking in hospital room?: None Help needed climbing 3-5 steps with a railing? : None 6 Click Score: 24    End of Session Equipment Utilized During Treatment: Gait belt Activity Tolerance: Patient tolerated treatment well Patient left: in bed;with call bell/phone within reach;with family/visitor present Nurse Communication: Mobility status      Time: 1100-1113 PT Time Calculation (min) (ACUTE ONLY): 13 min   Charges:   PT Evaluation $PT Eval Low Complexity: 1 Low            Ralene Bathe Kistler 10/30/2017, 11:27 AM [phone]

## 2017-10-30 NOTE — Discharge Summary (Signed)
Discharge Summary  Carl Wilson JJH:[id number] DOB: [birth year]  PCP: Patient, No Pcp Per  Admit date: 10/28/2017 Discharge date: 10/30/2017  Time spent: 25 minutes  Recommendations for Outpatient Follow-up:  1. Follow-up with your orthopedic surgeon 2. Follow-up with your primary care physician 3. Take your medications as prescribed 4. Continue wound care 5. Fall precautions  Discharge Diagnoses:  Active Hospital Problems   Diagnosis Date Noted  . Abscess of right foot 10/28/2017  . Type 2 diabetes mellitus with right diabetic foot infection ([facility]) 10/28/2017  . HTN (hypertension) 10/28/2017  . CKD stage 3 due to type 2 diabetes mellitus ([facility]) 10/28/2017    Resolved Hospital Problems  No resolved problems to display.    Discharge Condition: Stable  Diet recommendation: Heart healthy diabetic diet  Vitals:   10/29/17 2134 10/30/17 0515  BP: (!) 152/80 129/79  Pulse: 92 76  Resp: 16 14  Temp: 100.2 F (37.9 C) 97.8 F (36.6 C)  SpO2: 97% 96%    History of present illness:  Carl Wilson a 67 y.o.malewith medical history significant ofuncontrolled DM2, HTN, CKD stage 3, HLD, charcot with prior diabetic foot infections, diabetes polyneuropathy, presents to the ED with c/o R foot pain and swelling. L 5th metatarsal injury to foot a couple of months ago. Pain and swelling to Rfoot over past week. Associated erythema, and developed fever/chills today so family encouraged him to come in to ED.  ED Course:Tm 100.5. WBC 16.3k. Creat 2.5 with BUN 37. EDP preformed bedside I+D of foot with purulent drainage. BCx obtained, but no wound cultures obtained during I+D procedure.  Orthopedic consulted  10/30/2017: No acute events overnight.  Patient seen and examined at his bedside.  He has no new complaints.  MRI right foot reveals cellulitis with no sign of osteomyelitis or abscess.  Orthopedic surgery follow-up with no plan for surgery at this time.  On the day of  discharge, the patient was hemodynamically stable.  He will need to follow-up with his orthopedic surgeon and PCP posthospitalization.  He will also need to continue wound care.    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 ([facility])   HTN (hypertension)   CKD stage 3 due to type 2 diabetes mellitus ([facility])  Resolving sepsis 2/2 R foot wound in the setting of uncontrolled dm2, polyneuropathy and charcot foot Afebrile with no leukocytosis MRI revealed cellulitis with no sign of osteomyelitis or abscess Orthopedic surgery saw with no plan for surgical intervention at this time Follow-up with your orthopedic surgeon outpatient; Patient self reported he is being followed by an orthopedic surgeon for his feet wound Received IV cefepime, IV Flagyl, and IV vancomycin MRSA screening negative Start Keflex 500 mg twice daily x10 days  Resolving unconcontrolled dm2 with hyperglycemia A1C 11.6 C/w ISS, lantus, novolog Heart healthy carb diet Avoid hyperglycemia due to need for proper wound healing Follow-up with your PCP or follow-up with [facility]  Polyneuropathy C/w gabapentin Fall precautions  Improving AKI on CKD 4 Back to baseline with creatinine of 1.92 Creatinine presentation 2.50 Continue to avoid nephrotoxic agents/dehydration Follow-up with your nephrologist. Patient self reported that he has a referral to a nephrologist  HTN Blood pressure is well controlled C/w hypertensive meds Monitor  Chronic anxiety/depression C/w seroquel    Code Status: full   Family Communication:  Wife at bedside.  All questions answered to her satisfaction.  Consultants:  Ortho, Dr Lorin Mercy  Procedures:  I&D in the ED on 10/28/17   Antimicrobials:  Completed IV cefepim, IV flagyl, IV vancomycin  Keflex 500 mg twice daily x10 days    Discharge Exam: BP  129/79 (BP Location: Right Arm)   Pulse 76   Temp 97.8 F (36.6 C) (Oral)   Resp 14   Ht _0  (1.905 m)   Wt 128.9 kg   SpO2 96%   BMI 35.52 kg/m  . General: 67 y.o. year-old male well developed well nourished in no acute distress.  Alert and oriented x3. . Cardiovascular: Regular rate and rhythm with no rubs or gallops.  No thyromegaly or JVD noted.   Marland Kitchen Respiratory: Clear to auscultation with no wheezes or rales. Good inspiratory effort. . Abdomen: Soft nontender nondistended with normal bowel sounds x4 quadrants. . Musculoskeletal: No lower extremity edema. 2/4 pulses in all 4 extremities.  Charcot feet noted.  Right foot with mild swelling over the dorsum of the foot and plantar wound without purulence.  Left foot with healing ulcer at the plantar region without drainage. Marland Kitchen Psychiatry: Mood is appropriate for condition and setting  Discharge Instructions You were cared for by a hospitalist during your hospital stay. If you have any questions about your discharge medications or the care you received while you were in the hospital after you are discharged, you can call the unit and asked to speak with the hospitalist on call if the hospitalist that took care of you is not available. Once you are discharged, your primary care physician will handle any further medical issues. Please note that NO REFILLS for any discharge medications will be authorized once you are discharged, as it is imperative that you return to your primary care physician (or establish a relationship with a primary care physician if you do not have one) for your aftercare needs so that they can reassess your need for medications and monitor your lab values.   Allergies as of 10/30/2017      Reactions   Ambien [zolpidem] Other (See Comments)   hallucinations   Penicillins Hives   Has patient had a PCN reaction causing immediate rash, facial/tongue/throat swelling, SOB or lightheadedness with hypotension: Yes Has patient  had a PCN reaction causing severe rash involving mucus membranes or skin necrosis: No Has patient had a PCN reaction that required hospitalization: No Has patient had a PCN reaction occurring within the last 10 years: Yes If all of the above answers are "NO", then may proceed with Cephalosporin use.   Sulfamethoxazole Hives   Tape Rash   Can only use cloth/paper tape      Medication List    STOP taking these medications   bumetanide 2 MG tablet Commonly known as:  BUMEX   Insulin Lispro Prot & Lispro (50-50) 100 UNIT/ML Kwikpen Commonly known as:  HUMALOG 50/50 MIX   moexipril-hydrochlorothiazide 15-25 MG tablet Commonly known as:  UNIRETIC   potassium chloride SA 20 MEQ tablet Commonly known as:  K-DUR,KLOR-CON     TAKE these medications   blood glucose meter kit and supplies Kit Dispense based on patient and insurance preference. Use up to four times daily as directed. (FOR ICD-9 250.00, 250.01).   cephALEXin 500 MG capsule Commonly known as:  KEFLEX Take 1 capsule (500 mg total) by mouth every 12 (twelve) hours for 10 days.   divalproex 500 MG 24 hr tablet Commonly known as:  DEPAKOTE ER Take 1,500 mg by mouth at bedtime.  gabapentin 300 MG capsule Commonly known as:  NEURONTIN Take 300 mg by mouth 3 (three) times daily.   hydrochlorothiazide 25 MG tablet Commonly known as:  HYDRODIURIL Take 1 tablet (25 mg total) by mouth daily. Start taking on:  10/31/2017   insulin aspart 100 UNIT/ML injection Commonly known as:  novoLOG Inject 0-15 Units into the skin 3 (three) times daily with meals.   insulin aspart 100 UNIT/ML injection Commonly known as:  novoLOG Inject 4 Units into the skin 3 (three) times daily with meals.   insulin glargine 100 UNIT/ML injection Commonly known as:  LANTUS Inject 0.3 mLs (30 Units total) into the skin at bedtime.   insulin starter kit- syringes Misc 1 kit by Other route once for 1 dose.   l-methylfolate-B6-B12 3-35-2 MG Tabs  tablet Commonly known as:  METANX Take 1 tablet by mouth daily.   OZEMPIC [place] Inject 1 mg into the skin every 7 (seven) days.   QUEtiapine 100 MG tablet Commonly known as:  SEROQUEL Take 200 mg by mouth at bedtime.   sildenafil 50 MG tablet Commonly known as:  VIAGRA Take 50 mg by mouth daily as needed for erectile dysfunction.   trandolapril 2 MG tablet Commonly known as:  MAVIK Take 1 tablet (2 mg total) by mouth daily. Start taking on:  10/31/2017      Allergies  Allergen Reactions  . Ambien [Zolpidem] Other (See Comments)    hallucinations  . Penicillins Hives    Has patient had a PCN reaction causing immediate rash, facial/tongue/throat swelling, SOB or lightheadedness with hypotension: Yes Has patient had a PCN reaction causing severe rash involving mucus membranes or skin necrosis: No Has patient had a PCN reaction that required hospitalization: No Has patient had a PCN reaction occurring within the last 10 years: Yes If all of the above answers are "NO", then may proceed with Cephalosporin use.  . Sulfamethoxazole Hives  . Tape Rash    Can only use cloth/paper tape   Follow-up [facility]. Call in 1 day(s).   Why:  Please call for an appointment. Contact information: [address] [place]  [address] [phone]           The results of significant diagnostics from this hospitalization (including imaging, microbiology, ancillary and laboratory) are listed below for reference.    Significant Diagnostic Studies: Mr Foot Right Wo Contrast  Result Date: 10/29/2017 CLINICAL DATA:  Erythema along the lateral foot.  Ulcer. EXAM: MRI OF THE RIGHT FOREFOOT WITHOUT CONTRAST TECHNIQUE: Multiplanar, multisequence MR imaging of the right forefoot was performed. No intravenous contrast was administered due to renal insufficiency. COMPARISON:  Radiographs from 10/28/2017 FINDINGS: Bones/Joint/Cartilage No  findings of osteomyelitis. Hindfoot fusion including the talus, calcaneus, cuboid, and navicular. Degenerative midfoot arthropathy is specially along the proximal margin of the middle cuneiform. Ligaments Lisfranc ligament intact. Muscles and Tendons Distal tibialis posterior tenosynovitis. There is diffuse edema of regional musculature in the foot, probably neurogenic, less likely due to myositis. Thickened medial band of the plantar fascia posteriorly, probably from plantar fasciitis. No drainable intramuscular abscess. Soft tissues Extensive dorsal subcutaneous edema. This is nonspecific but cellulitis is not excluded. Subcutaneous phlegmon along the midfoot presumably in the vicinity of the plantar ulcer, without a drainable abscess observed, in the vicinity of image 49/4 and image 9/10. IMPRESSION: 1. Plantar ulcer and notable subcutaneous edema which may reflect cellulitis. No drainable abscess. No osteomyelitis identified. 2. Diffuse muscular  edema, probably neurogenic given the involvement of numerous muscle groups. 3. Hindfoot fusion including the talus, calcaneus, cuboid, and navicular. There is some degenerative midfoot arthropathy is specially along the proximal margin of the middle cuneiform. 4. Thickening of the medial band of the plantar fascia suspicious for plantar fasciitis. Electronically Signed   By: Van Clines M.D.   On: 10/29/2017 18:10   Dg Foot Complete Right  Result Date: 10/28/2017 CLINICAL DATA:  Foot infection EXAM: RIGHT FOOT COMPLETE - 3+ VIEW COMPARISON:  None. FINDINGS: Soft tissue swelling of the included ankle and foot with pes planus deformity. 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 No acute fracture nor joint dislocations. No frank bone destruction. The tufts of the first and second digits are excluded on the oblique view but appear unremarkable on the  additional views. There is osteoarthritis of the fifth PIP articulation with joint space narrowing, sclerosis and spurring. No apparent soft tissue ulceration. IMPRESSION: 1. Pes planus with tibiotalar, subtalar and midfoot osteoarthritis. Mild osteoarthritis of the fifth PIP. 2. Soft tissue swelling of the included ankle and foot. 3. No frank bone destruction. Electronically Signed   By: Ashley Royalty M.D.   On: 10/28/2017 17:58    Microbiology: Recent Results (from the past 240 hour(s))  Blood culture (routine x 2)     Status: None (Preliminary result)   Collection Time: 10/28/17  8:12 PM  Result Value Ref [place] Status   Specimen Description   Final    BLOOD LEFT ANTECUBITAL Performed at [facility] [address], [place], [place] [address]    Special Requests   Final    BOTTLES DRAWN AEROBIC AND ANAEROBIC Blood Culture results may not be optimal due to an excessive volume of blood received in culture bottles Performed at [facility] [address], [place], [place] [address]    Culture   Final    NO GROWTH 2 DAYS Performed at [facility] [address], [place], [place] [address]    Report Status PENDING  Incomplete  Blood culture (routine x 2)     Status: None (Preliminary result)   Collection Time: 10/28/17 11:45 PM  Result Value Ref [place] Status   Specimen Description   Final    BLOOD LEFT ANTECUBITAL Performed at [facility] [address] [place], [place] [address]    Special Requests   Final    BOTTLES DRAWN AEROBIC AND ANAEROBIC Blood Culture adequate volume Performed at [facility] [address], [place], [place] [address]    Culture   Final    NO GROWTH 1 DAY Performed at [facility], [place] [address], [place], [place] [address]    Report Status PENDING  Incomplete  MRSA PCR Screening     Status: None   Collection Time: 10/29/17  5:27 PM  Result Value Ref [place] Status     MRSA by PCR NEGATIVE NEGATIVE Final    Comment:        The GeneXpert MRSA Assay (FDA approved for NASAL specimens only), is one component of a comprehensive MRSA colonization surveillance program. It is not intended to diagnose MRSA infection nor to guide or monitor treatment for MRSA infections. Performed at [facility], [place] [address] [place], [place] [address]      Labs: Basic Metabolic Panel: Recent Labs  Lab 10/28/17 1804 10/29/17  9532 10/30/17 0505  NA 135 138 138  K 4.6 4.6 4.2  CL 97* 100 102  CO2  --  28 27  GLUCOSE 425* 381* 220*  BUN 37* 38* 32*  CREATININE 2.50* 2.16* 1.92*  CALCIUM  --  9.0 9.2   Liver Function Tests: No results for input(s): AST, ALT, ALKPHOS, BILITOT, PROT, ALBUMIN in the last 168 hours. No results for input(s): LIPASE, AMYLASE in the last 168 hours. No results for input(s): AMMONIA in the last 168 hours. CBC: Recent Labs  Lab 10/28/17 1555 10/28/17 1804 10/29/17 0436 10/30/17 0505  WBC 16.3*  --  12.7* 8.7  NEUTROABS 13.2*  --   --   --   HGB 11.9* 11.9* 9.8* 10.0*  HCT 34.8* 35.0* 29.9* 29.9*  MCV 89.7  --  90.1 89.5  PLT 306  --  242 255   Cardiac Enzymes: No results for input(s): CKTOTAL, CKMB, CKMBINDEX, TROPONINI in the last 168 hours. BNP: BNP (last 3 results) No results for input(s): BNP in the last 8760 hours.  ProBNP (last 3 results) No results for input(s): PROBNP in the last 8760 hours.  CBG: Recent Labs  Lab 10/29/17 0737 10/29/17 1205 10/29/17 1632 10/29/17 2135 10/30/17 0829  GLUCAP 310* 329* 312* 319* 186*       Signed:  Kayleen Memos, MD [facility] 10/30/2017, 12:05 PM

## 2017-11-02 LAB — CULTURE, BLOOD (ROUTINE X 2): Culture: NO GROWTH

## 2017-11-03 LAB — CULTURE, BLOOD (ROUTINE X 2)
Culture: NO GROWTH
Special Requests: ADEQUATE

## 2020-05-08 IMAGING — MR MR FOOT*R* W/O CM
4 of 6 series · 19 of 40 positions shown · non-contrast
Comparison: Radiographs from 10/28/2017

CLINICAL DATA: Erythema along the lateral foot.  Ulcer.

EXAM:
MRI OF THE RIGHT FOREFOOT WITHOUT CONTRAST
TECHNIQUE: Multiplanar, multisequence MR imaging of the right forefoot was
performed. No intravenous contrast was administered due to renal
insufficiency.

[Series 4: T1 · coronal · 3.0mm · 0.27mm/px · 8 of 68 slices shown (1 of 2)]
[im 1/68]
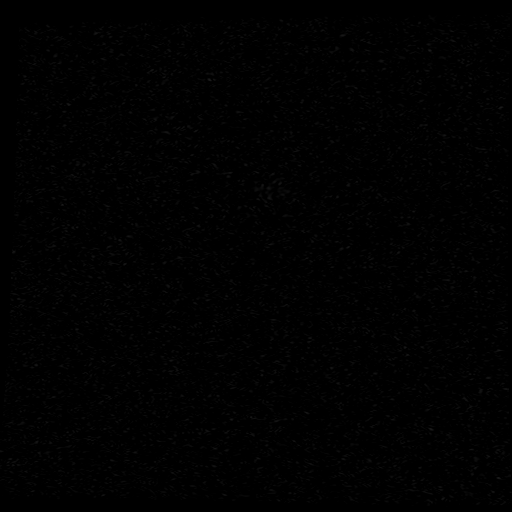
[im 8/68]
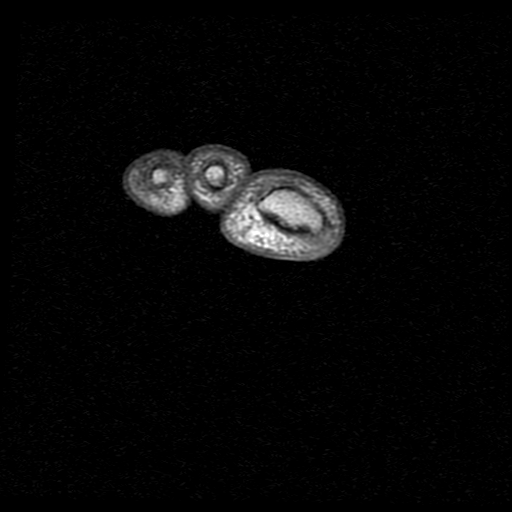
[im 23/68]
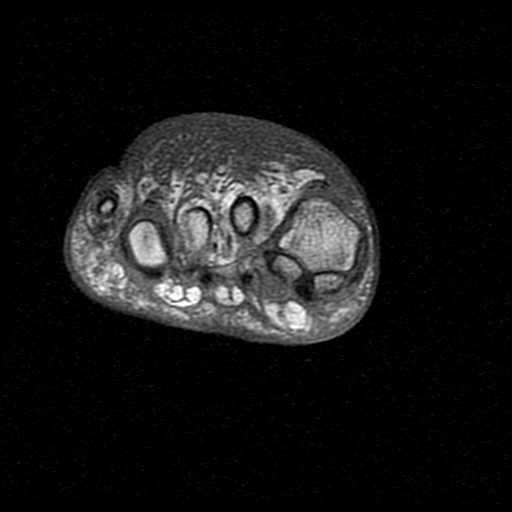
[im 30/68]
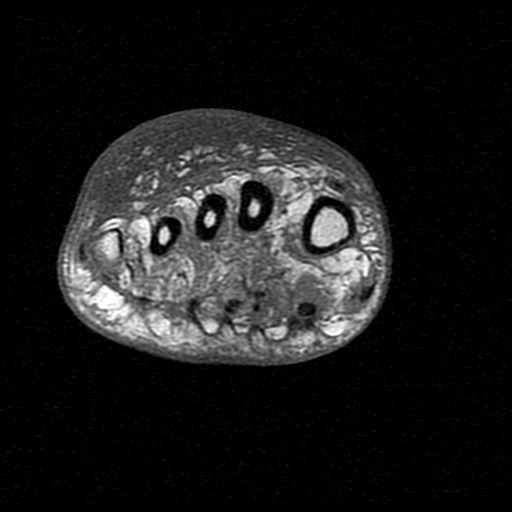
[im 38/68]
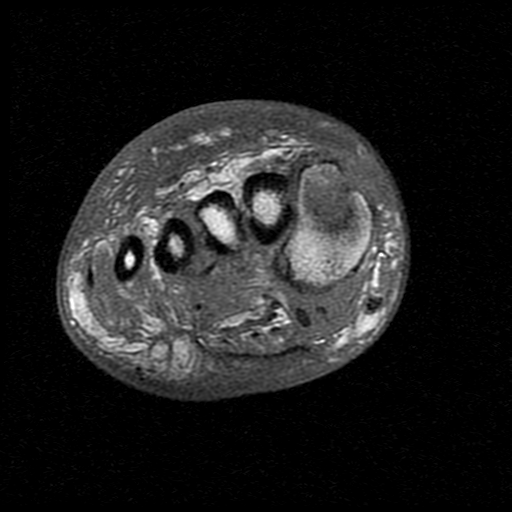
[im 45/68]
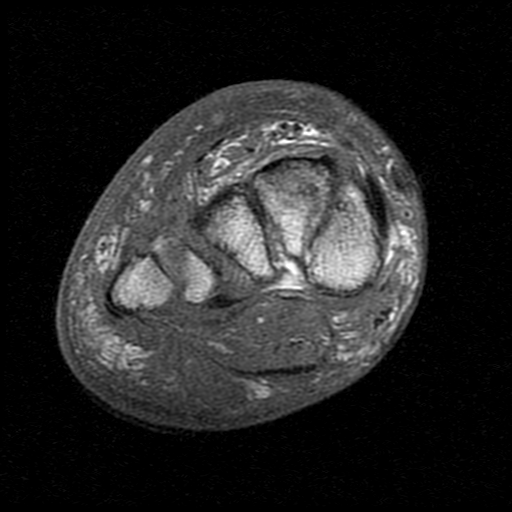
[im 60/68]
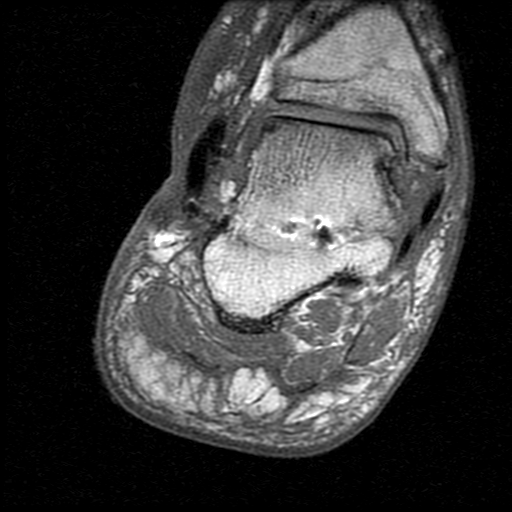
[im 68/68]
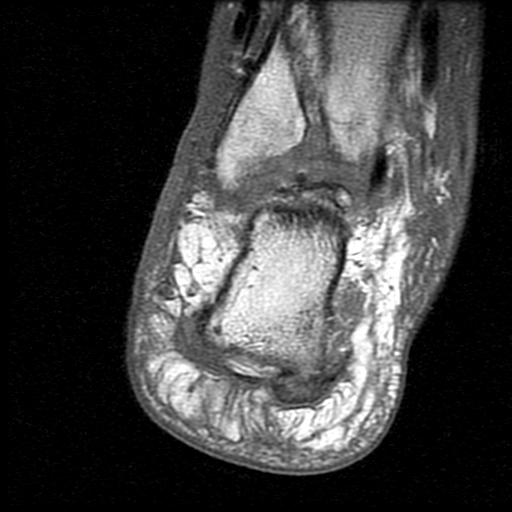

[Series 6: T2 · coronal · 3.0mm · 0.27mm/px · 3 of 68 slices shown]
[im 8/68]
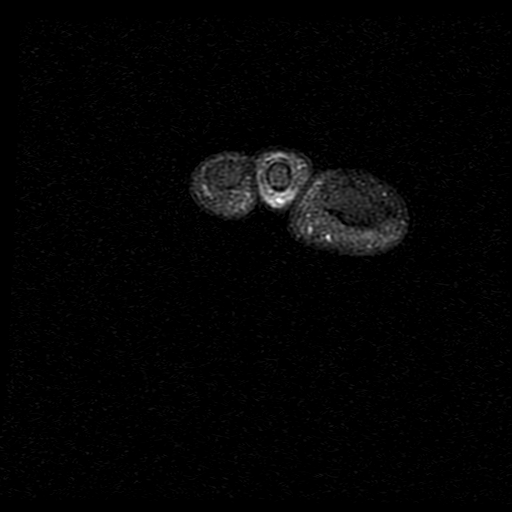
[im 38/68]
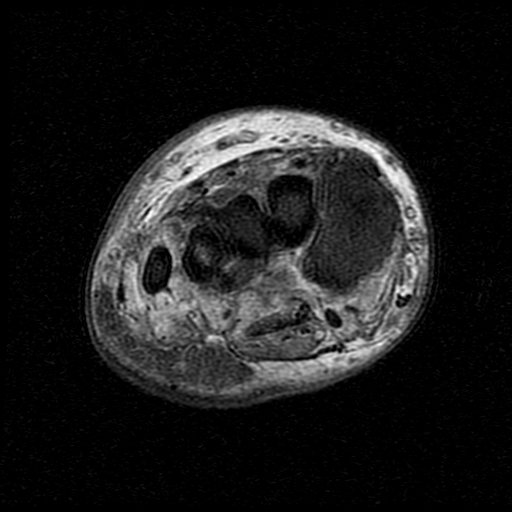
[im 60/68]
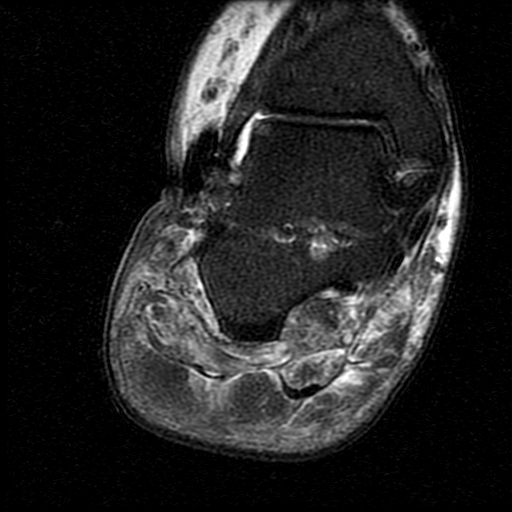

[Series 7: STIR · axial · 3.0mm · 0.43mm/px · z∈[-128,-9]mm · 3 of 32 slices shown]
[im 1/32]
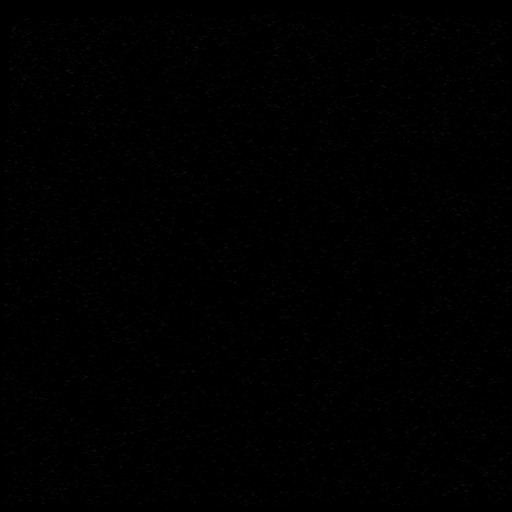
[im 16/32]
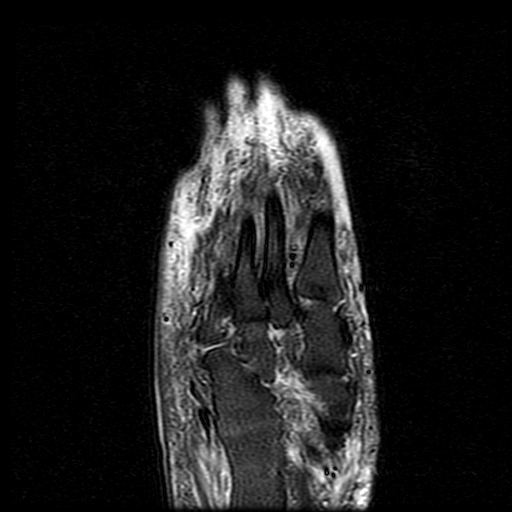
[im 32/32]
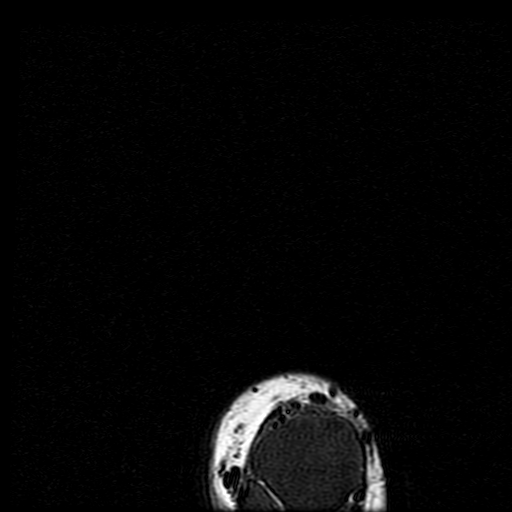

[Series 8: T1 · axial · 3.0mm · 0.43mm/px · z∈[-128,-9]mm · 5 of 32 slices shown (2 of 2)]
[im 1/32]
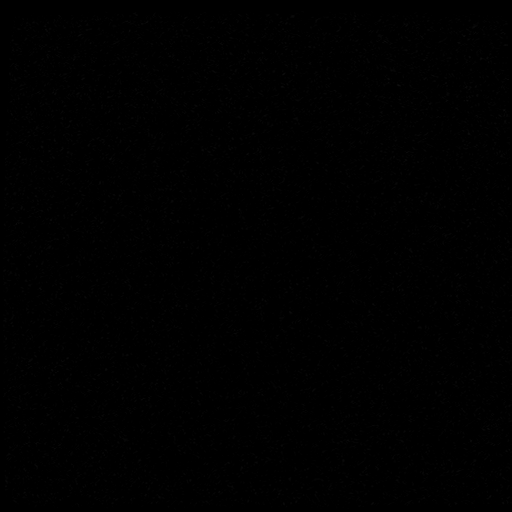
[im 8/32]
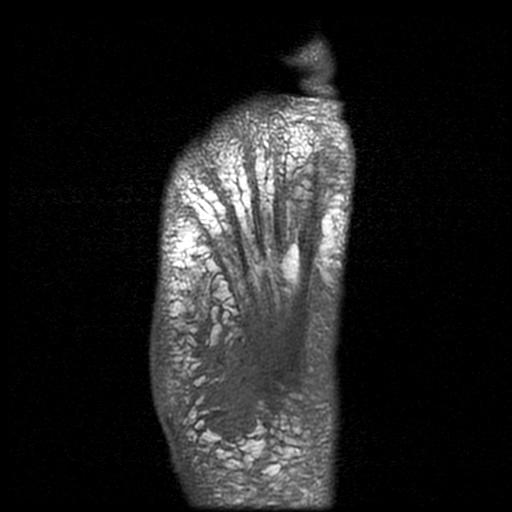
[im 16/32]
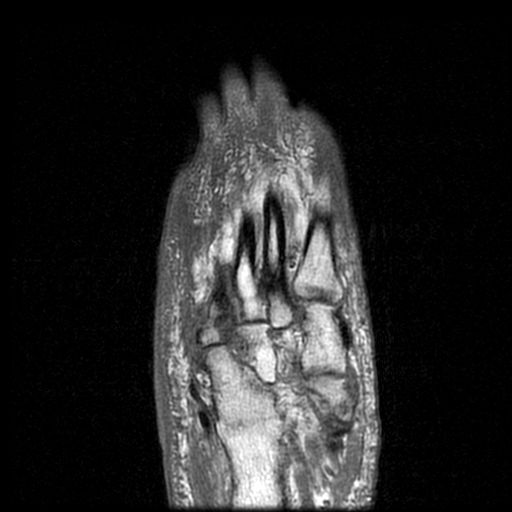
[im 24/32]
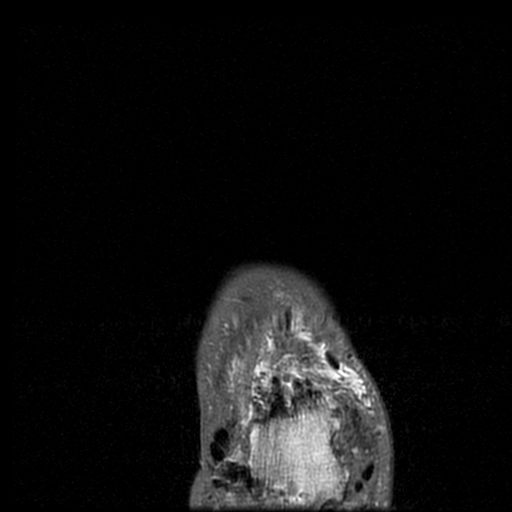
[im 32/32]
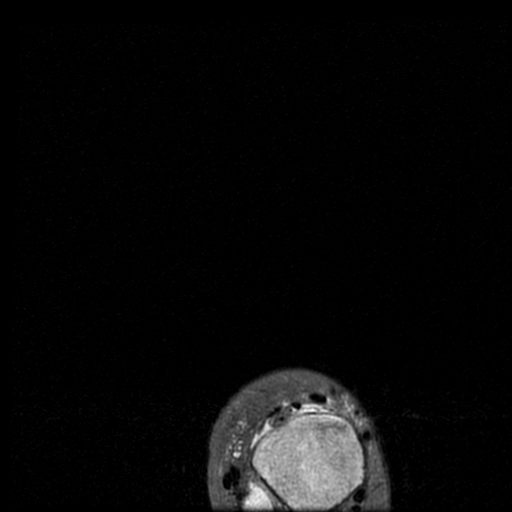

[19 of 40 positions shown; findings below may reference images not displayed]

FINDINGS: Bones/Joint/Cartilage

No findings of osteomyelitis.

Hindfoot fusion including the talus, calcaneus, cuboid, and
navicular.

Degenerative midfoot arthropathy is specially along the proximal
margin of the middle cuneiform.

Ligaments

Lisfranc ligament intact.

Muscles and Tendons

Distal tibialis posterior tenosynovitis. There is diffuse edema of
regional musculature in the foot, probably neurogenic, less likely
due to myositis.

Thickened medial band of the plantar fascia posteriorly, probably
from plantar fasciitis.

No drainable intramuscular abscess.

Soft tissues

Extensive dorsal subcutaneous edema. This is nonspecific but
cellulitis is not excluded. Subcutaneous phlegmon along the midfoot
presumably in the vicinity of the plantar ulcer, without a drainable
abscess observed, in the vicinity of image 49/4 and image [DATE].
IMPRESSION: 1. Plantar ulcer and notable subcutaneous edema which may reflect
cellulitis. No drainable abscess. No osteomyelitis identified.
2. Diffuse muscular edema, probably neurogenic given the involvement
of numerous muscle groups.
3. Hindfoot fusion including the talus, calcaneus, cuboid, and
navicular. There is some degenerative midfoot arthropathy is
specially along the proximal margin of the middle cuneiform.
4. Thickening of the medial band of the plantar fascia suspicious
for plantar fasciitis.
